# Patient Record
Sex: Male | Born: 1970 | Race: White | Hispanic: No | Marital: Married | Smoking: Never smoker
Health system: Southern US, Community
[De-identification: ages and names within clinical notes are randomized; demographics above are authoritative.]

## PROBLEM LIST (undated history)

## (undated) DIAGNOSIS — S43014A Anterior dislocation of right humerus, initial encounter: Secondary | ICD-10-CM

## (undated) DIAGNOSIS — S43006A Unspecified dislocation of unspecified shoulder joint, initial encounter: Secondary | ICD-10-CM

## (undated) DIAGNOSIS — M75101 Unspecified rotator cuff tear or rupture of right shoulder, not specified as traumatic: Secondary | ICD-10-CM

## (undated) HISTORY — PX: SHOULDER SURGERY: SHX246

---

## 2006-02-17 HISTORY — PX: LACERATION REPAIR: SHX5168

## 2006-06-22 ENCOUNTER — Emergency Department (HOSPITAL_COMMUNITY): Admission: EM | Admit: 2006-06-22 | Discharge: 2006-06-22 | Payer: Self-pay | Admitting: Emergency Medicine

## 2007-02-18 HISTORY — PX: OTHER SURGICAL HISTORY: SHX169

## 2007-07-14 ENCOUNTER — Encounter: Admission: RE | Admit: 2007-07-14 | Discharge: 2007-07-14 | Payer: Self-pay | Admitting: Family Medicine

## 2008-01-08 ENCOUNTER — Emergency Department (HOSPITAL_COMMUNITY): Admission: EM | Admit: 2008-01-08 | Discharge: 2008-01-08 | Payer: Self-pay | Admitting: Emergency Medicine

## 2008-01-17 ENCOUNTER — Encounter: Admission: RE | Admit: 2008-01-17 | Discharge: 2008-01-17 | Payer: Self-pay | Admitting: Orthopedic Surgery

## 2008-01-20 ENCOUNTER — Ambulatory Visit (HOSPITAL_BASED_OUTPATIENT_CLINIC_OR_DEPARTMENT_OTHER): Admission: RE | Admit: 2008-01-20 | Discharge: 2008-01-20 | Payer: Self-pay | Admitting: Orthopedic Surgery

## 2009-01-25 ENCOUNTER — Encounter: Admission: RE | Admit: 2009-01-25 | Discharge: 2009-01-25 | Payer: Self-pay | Admitting: Family Medicine

## 2009-01-27 ENCOUNTER — Ambulatory Visit (HOSPITAL_BASED_OUTPATIENT_CLINIC_OR_DEPARTMENT_OTHER): Admission: RE | Admit: 2009-01-27 | Discharge: 2009-01-27 | Payer: Self-pay | Admitting: Otolaryngology

## 2009-02-03 ENCOUNTER — Ambulatory Visit: Payer: Self-pay | Admitting: Internal Medicine
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
## 2010-07-05 NOTE — Consult Note (Signed)
NAME:  Clinton Stevenson, DEMICCO NO.:  [id number]   MEDICAL RECORD NO.:  [id number]          PATIENT TYPE:  EMS   LOCATION:  MAJO                         FACILITY:  [facility]   PHYSICIAN:  Antony Contras, MD     DATE OF BIRTH:  27-Sep-1970   DATE OF CONSULTATION:  06/22/2006  DATE OF DISCHARGE:                                 CONSULTATION   REQUESTING SERVICE:  Emergency department.   CHIEF COMPLAINT:  Facial laceration.   HISTORY OF PRESENT ILLNESS:  The patient is a 40 year old white male who  was walking his dog about 5:45 this evening when a neighbor's dog got  loose and attacked both him and his dog.  He sustained a laceration to  the lower lip extending into the cheek.  He had a significant amount of  bleeding, as well as pain, and came directly to the emergency  department.  His tetanus is up-to-date and the patient's rabies shots  are up to date.  He has no other complaints.   PAST MEDICAL HISTORY:  Depression.   PAST SURGICAL HISTORY:  Knee surgery.   MEDICATIONS:  Zoloft.   ALLERGIES:  NO KNOWN DRUG ALLERGIES.   FAMILY HISTORY:  Noncontributory.   SOCIAL HISTORY:  The patient lives locally.  He works as a Magazine features editor.  He denies smoking and drinks a couple of bottles of wine per week.   REVIEW OF SYSTEMS:  Negative except as listed above.   PHYSICAL EXAMINATION:  VITAL SIGNS:  Temperature 98.4, blood pressure  131/78, pulse 62, respirations 20.  GENERAL:  The patient is in no acute distress and is pleasant and  cooperative and accompanied by his wife.  VOICE:  Voice is normal.  EARS:  External ears are normal.  External canals are patent without  significant cerumen.  Tympanic membranes are intact and middle ear  spaces are aerated.  NOSE:  External nose is normal with nontender dorsum that is deviated  slightly to the left side.  He has very, very light abrasions to the  dorsum.  Nasal passages are patent bilaterally with a relatively midline  septum  and mild turbinate hypertrophy.  EYES:  Extraocular movements intact.  Pupils are equal, round and  reactive to light.  There is no orbital step-off.  FACE:  Facial bony structures are normal with normal occlusion and  normal facial bony contour.  There is a 5-cm laceration extending  through the left lower lip and extending toward the cheek laterally.  The laceration is not through-and-through.  There is no significant  bleeding presently.  There are no facial injuries.  MOUTH:  Oral cavity is normal with normal dentition and normal tongue.  The floor of mouth is normal.  Buccal mucosa is intact.  Oropharynx is  normal.  NECK:  There is no mass or adenopathy.  The neck is nontender.  THYROID GLAND:  There is no enlargement or mass.  CRANIAL NERVES:  II-XII are intact including normal lower lip movement  on both sides.   ASSESSMENT:  The patient is a 40 year old white male with a 5-cm left  lower lip laceration.   PLAN:  The laceration will be closed primarily in the emergency  department.  Dog bites carry a low risk of infection and so closure is  possible.  After closure, he will be stable for discharge from the  emergency department.  Wound care will be discussed, including cleaning  incision twice daily with half-strength peroxide and applying bacitracin  ointment.  He should keep the laceration dry for 2 days before shower.  Followup will be arranged in 1 week for suture removal.      Antony Contras, MD  Electronically Signed     DDB/MEDQ  D:  06/22/2006  T:  06/23/2006  Job:  [id number]   cc:   Antony Contras, MD

## 2010-07-05 NOTE — Op Note (Signed)
NAMEAYMAN, BRULL NO.:  [id number]   MEDICAL RECORD NO.:  [id number]          PATIENT TYPE:  EMS   LOCATION:  MAJO                         FACILITY:  [facility]   PHYSICIAN:  Antony Contras, MD     DATE OF BIRTH:  22-Oct-1970   DATE OF PROCEDURE:  06/22/2006  DATE OF DISCHARGE:                               OPERATIVE REPORT   PREOPERATIVE DIAGNOSIS:  Left lower lip laceration measuring 5 cm.   POSTOP DIAGNOSIS:   PROCEDURE:  Intermediate complexity closure of left lower lip  laceration.   SURGEON:  Dr. Christia Reading.   ANESTHESIA:  Local.   COMPLICATIONS:  None.   INDICATIONS:  The patient is a 40 year old white male who was attacked  by a dog earlier this evening sustaining the laceration described above.   FINDINGS:  There is a 5 cm laceration extending from the inner surface  of the lower lip on the left side and crossing the lower lip and  extending through this down the skin of the lower lip toward the left  cheek.  The laceration is not through the buccal mucosa otherwise.  There is no significant bleeding.   DESCRIPTION OF PROCEDURE:  The patient was identified in the emergency  department and informed consent was obtained.  The left cheek and mouth  were prepped and draped in sterile fashion.  The laceration was injected  with 2% lidocaine with 1:200,000 epinephrine.  The laceration was then  copiously irrigated with saline.  The deep layer was closed with 4-0  Vicryl suture in a simple interrupted fashion.  Skin layer was then  closed with 5-0 Prolene in a simple interrupted fashion through the skin  and 5-0 chromic in a simple interrupted fashion on the mucosa of the  lip.  After this, bacitracin ointment was applied.  The patient was then  returned to emergency room care room in stable condition without  complication.      Antony Contras, MD  Electronically Signed     DDB/MEDQ  D:  06/22/2006  T:  06/23/2006  Job:  [id number]   cc:    Antony Contras, MD

## 2013-05-09 ENCOUNTER — Encounter (HOSPITAL_COMMUNITY): Payer: Self-pay | Admitting: Emergency Medicine

## 2013-05-09 ENCOUNTER — Emergency Department (HOSPITAL_COMMUNITY): Payer: BC Managed Care – PPO

## 2013-05-09 ENCOUNTER — Emergency Department (HOSPITAL_COMMUNITY)
Admission: EM | Admit: 2013-05-09 | Discharge: 2013-05-09 | Disposition: A | Discharge status: Home / Self Care | Payer: BC Managed Care – PPO | Attending: Emergency Medicine | Admitting: Emergency Medicine

## 2013-05-09 DIAGNOSIS — Z9889 Other specified postprocedural states: Secondary | ICD-10-CM | POA: Insufficient documentation

## 2013-05-09 DIAGNOSIS — Y9289 Other specified places as the place of occurrence of the external cause: Secondary | ICD-10-CM | POA: Insufficient documentation

## 2013-05-09 DIAGNOSIS — S43016A Anterior dislocation of unspecified humerus, initial encounter: Secondary | ICD-10-CM | POA: Insufficient documentation

## 2013-05-09 DIAGNOSIS — W108XXA Fall (on) (from) other stairs and steps, initial encounter: Secondary | ICD-10-CM | POA: Insufficient documentation

## 2013-05-09 DIAGNOSIS — Y9389 Activity, other specified: Secondary | ICD-10-CM | POA: Insufficient documentation

## 2013-05-09 DIAGNOSIS — S42309A Unspecified fracture of shaft of humerus, unspecified arm, initial encounter for closed fracture: Secondary | ICD-10-CM | POA: Insufficient documentation

## 2013-05-09 DIAGNOSIS — F411 Generalized anxiety disorder: Secondary | ICD-10-CM | POA: Insufficient documentation

## 2013-05-09 MED ORDER — KETOROLAC TROMETHAMINE 30 MG/ML IJ SOLN
30.0000 mg | Freq: Once | INTRAMUSCULAR | Status: AC
  Administered 2013-05-09: 30 mg via INTRAVENOUS
  Filled 2013-05-09: qty 1

## 2013-05-09 MED ORDER — OXYCODONE-ACETAMINOPHEN 5-325 MG PO TABS: 1.0000 | ORAL_TABLET | Freq: Four times a day (QID) | ORAL | Status: DC | PRN

## 2013-05-09 MED ORDER — OXYCODONE-ACETAMINOPHEN 5-325 MG PO TABS
2.0000 | ORAL_TABLET | Freq: Once | ORAL | Status: AC
  Administered 2013-05-09: 2 via ORAL
  Filled 2013-05-09: qty 2

## 2013-05-09 MED ORDER — PROPOFOL 10 MG/ML IV BOLUS
INTRAVENOUS | Status: AC | PRN
  Administered 2013-05-09: 75 mg via INTRAVENOUS
  Administered 2013-05-09: 30 mg via INTRAVENOUS

## 2013-05-09 MED ORDER — PROPOFOL 10 MG/ML IV BOLUS
1.0000 mg/kg | Freq: Once | INTRAVENOUS | Status: AC
Start: 2013-05-09 — End: 2013-05-09

## 2013-05-09 MED ORDER — PROPOFOL 10 MG/ML IV BOLUS
0.5000 mg/kg | Freq: Once | INTRAVENOUS | Status: DC
  Filled 2013-05-09: qty 20

## 2013-05-09 NOTE — Discharge Instructions (Signed)

## 2013-05-09 NOTE — ED Notes (Signed)
Pt ambulatory and VS stable. No pain. Spouse will drive him home.

## 2013-05-09 NOTE — ED Provider Notes (Signed)
CSN: [id number]     Arrival date & time 05/09/13  0343 History   First MD Initiated Contact with Patient 05/09/13 0406     Chief Complaint  Patient presents with  . Fall  . Shoulder Pain     (Consider location/radiation/quality/duration/timing/severity/associated sxs/prior Treatment) Patient is a 43 y.o. male presenting with fall and shoulder pain. The history is provided by the patient.  Fall This is a new problem. The current episode started less than 1 hour ago. The problem occurs constantly. The problem has not changed since onset.Pertinent negatives include no chest pain, no abdominal pain, no headaches and no shortness of breath. Nothing aggravates the symptoms. Nothing relieves the symptoms. The treatment provided no relief.  Shoulder Pain This is a recurrent problem. The current episode started less than 1 hour ago. The problem occurs constantly. The problem has not changed since onset.Pertinent negatives include no chest pain, no abdominal pain, no headaches and no shortness of breath. Nothing aggravates the symptoms. Nothing relieves the symptoms. The treatment provided no relief.    History reviewed. No pertinent past medical history. Past Surgical History  Procedure Laterality Date  . Rotator cuff surgery    . Shoulder surgery Right     Rotator Cuff   No family history on file. History  Substance Use Topics  . Smoking status: Never Smoker   . Smokeless tobacco: Never Used  . Alcohol Use: Yes     Comment: occasionally    Review of Systems  Respiratory: Negative for shortness of breath.   Cardiovascular: Negative for chest pain.  Gastrointestinal: Negative for abdominal pain.  Neurological: Negative for headaches.  All other systems reviewed and are negative.      Allergies  Review of patient's allergies indicates not on file.  Home Medications  No current outpatient prescriptions on file. BP 148/113  Pulse 70  Temp(Src) 97.9 F (36.6 C) (Oral)  Resp 20   SpO2 100% Physical Exam  Constitutional: He is oriented to person, place, and time. He appears well-developed and well-nourished. No distress.  HENT:  Head: Normocephalic and atraumatic. Head is without raccoon's eyes and without Battle's sign.  Right Ear: No mastoid tenderness. No hemotympanum.  Left Ear: No mastoid tenderness. No hemotympanum.  Mouth/Throat: Oropharynx is clear and moist.  Eyes: Conjunctivae are normal. Pupils are equal, round, and reactive to light.  Neck: Normal range of motion. Neck supple.  No midline c t or l spine tenderness  Cardiovascular: Normal rate, regular rhythm and intact distal pulses.   Pulmonary/Chest: Effort normal and breath sounds normal. He has no wheezes. He has no rales.  Abdominal: Soft. Bowel sounds are normal. There is no tenderness. There is no rebound and no guarding.  Musculoskeletal: Normal range of motion.  RUE neurovascularly intact intact biceps and triceps tendons, intact flexion and extension pronation and supination.  Cap refill < 2 sec to all digits.  No winging of the scapula  Neurological: He is alert and oriented to person, place, and time. He has normal reflexes.  Skin: Skin is warm and dry.  Psychiatric: His mood appears anxious.    ED Course  ORTHOPEDIC INJURY TREATMENT Date/Time: 05/09/2013 5:00 AM Performed by: Jasmine Awe Authorized by: Jasmine Awe Consent: Verbal consent obtained. written consent obtained. Risks and benefits: risks, benefits and alternatives were discussed Consent given by: patient Patient understanding: patient states understanding of the procedure being performed Patient consent: the patient's understanding of the procedure matches consent given Procedure consent: procedure consent  matches procedure scheduled Site marked: the operative site was marked Imaging studies: imaging studies available Patient identity confirmed: arm band Injury location: shoulder Location details:  right shoulder Injury type: fracture-dislocation Dislocation type: inferior Pre-procedure neurovascular assessment: neurovascularly intact Pre-procedure distal perfusion: normal Pre-procedure neurological function: normal Pre-procedure range of motion: reduced Local anesthesia used: no Patient sedated: yes Sedation type: moderate (conscious) sedation Sedatives: propofol Vitals: Vital signs were monitored during sedation. Manipulation performed: yes Skeletal traction used: yes Reduction successful: yes X-ray confirmed reduction: yes Immobilization: sling Post-procedure neurovascular assessment: post-procedure neurovascularly intact Post-procedure distal perfusion: normal Post-procedure range of motion: normal Patient tolerance: Patient tolerated the procedure well with no immediate complications.   (including critical care time) Labs Review Labs Reviewed - No data to display Imaging Review No results found.   EKG Interpretation None      MDM   Final diagnoses:  None    440 am case d/w Dr. Dion SaucierLandau via phone who reviewed filsm.  Achieve good sedation with propofol and attempt gentle traction to reduce.  Then immobilize and obtain CT.  Refer to office as outpatient  Patient and wife informed of need for close follow up with Dr. Dion SaucierLandau given patient's history.  Both verbalize understanding and agree to follow up  Corneisha Alvi K Gibril Mastro-Rasch, MD 05/09/13 [phone]

## 2013-05-09 NOTE — Sedation Documentation (Signed)
Patient back at baseline

## 2013-05-09 NOTE — ED Notes (Signed)
Denies LOC. Denies head trauma. Patient states that now he is having some numbness in his right upper extremity. Pulses palpable.

## 2013-05-09 NOTE — ED Notes (Signed)
Patient was taking dog out for a walk, dog pulled patient and patient fell down 4 steps and landed on his right shoulder on the concrete. Patient is guarding extremity, CNS intact.

## 2013-05-12 ENCOUNTER — Emergency Department (HOSPITAL_COMMUNITY): Payer: BC Managed Care – PPO

## 2013-05-12 ENCOUNTER — Emergency Department (HOSPITAL_COMMUNITY)
Admission: EM | Admit: 2013-05-12 | Discharge: 2013-05-12 | Disposition: A | Discharge status: Home / Self Care | Payer: BC Managed Care – PPO | Attending: Emergency Medicine | Admitting: Emergency Medicine

## 2013-05-12 ENCOUNTER — Encounter (HOSPITAL_COMMUNITY): Payer: Self-pay | Admitting: Emergency Medicine

## 2013-05-12 DIAGNOSIS — Y939 Activity, unspecified: Secondary | ICD-10-CM | POA: Insufficient documentation

## 2013-05-12 DIAGNOSIS — Y929 Unspecified place or not applicable: Secondary | ICD-10-CM | POA: Insufficient documentation

## 2013-05-12 DIAGNOSIS — Z79899 Other long term (current) drug therapy: Secondary | ICD-10-CM | POA: Insufficient documentation

## 2013-05-12 DIAGNOSIS — S43014A Anterior dislocation of right humerus, initial encounter: Secondary | ICD-10-CM

## 2013-05-12 DIAGNOSIS — S43016A Anterior dislocation of unspecified humerus, initial encounter: Secondary | ICD-10-CM | POA: Insufficient documentation

## 2013-05-12 DIAGNOSIS — X58XXXA Exposure to other specified factors, initial encounter: Secondary | ICD-10-CM | POA: Insufficient documentation

## 2013-05-12 HISTORY — DX: Unspecified dislocation of unspecified shoulder joint, initial encounter: S43.006A

## 2013-05-12 MED ORDER — HYDROMORPHONE HCL 2 MG PO TABS: 2.0000 mg | ORAL_TABLET | ORAL | Status: DC | PRN

## 2013-05-12 MED ORDER — PROPOFOL 10 MG/ML IV BOLUS
INTRAVENOUS | Status: AC | PRN
  Administered 2013-05-12 (×2): 40 mg via INTRAVENOUS

## 2013-05-12 MED ORDER — KETOROLAC TROMETHAMINE 30 MG/ML IJ SOLN
30.0000 mg | Freq: Once | INTRAMUSCULAR | Status: AC
  Administered 2013-05-12: 30 mg via INTRAVENOUS
  Filled 2013-05-12: qty 1

## 2013-05-12 MED ORDER — HYDROMORPHONE HCL PF 1 MG/ML IJ SOLN
1.0000 mg | Freq: Once | INTRAMUSCULAR | Status: AC
  Administered 2013-05-12: 1 mg via INTRAVENOUS
  Filled 2013-05-12: qty 1

## 2013-05-12 MED ORDER — PROPOFOL 10 MG/ML IV BOLUS
200.0000 mg | Freq: Once | INTRAVENOUS | Status: AC
  Administered 2013-05-12: 200 mg via INTRAVENOUS
  Filled 2013-05-12: qty 20

## 2013-05-12 MED ORDER — ONDANSETRON HCL 4 MG/2ML IJ SOLN
4.0000 mg | Freq: Once | INTRAMUSCULAR | Status: AC
  Administered 2013-05-12: 4 mg via INTRAVENOUS
  Filled 2013-05-12: qty 2

## 2013-05-12 NOTE — ED Notes (Signed)
Pt had R shoulder reduced on Monday.  R shoulder dislocated at 0230 this am.  Pt was sent here by Dr Madelon Lipsaffrey b/c anesthesiologist would not allow reduction d/t pt eating 2 fig newtons at 0800.

## 2013-05-12 NOTE — Progress Notes (Signed)
Orthopedic Tech Progress Note Patient Details:  Clinton Stevenson 1970-06-07 [id number]  Ortho Devices Type of Ortho Device: Shoulder immobilizer Ortho Device/Splint Interventions: Application   Cammer, Mickie BailJennifer Carol 05/12/2013, 12:13 PM

## 2013-05-12 NOTE — ED Provider Notes (Signed)
I saw and evaluated the patient, reviewed the resident's note and I agree with the findings and plan.   EKG Interpretation None      Pt is a 43 y.o. M with R shoulder dislocation.  [facility]  Sent to ED for reduction. Patient was seen 3/23 for another anterior right shoulder dislocation. Patient given propofol and shoulder was successfully reduced. Postreduction x-ray is normal. Patient is neurovascular intact. We'll discharge home.  Procedural sedation Performed by: Raelyn NumberWARD, Xabi Wittler N  and resident Windy Cannyouglas Bratlik Consent: Verbal consent obtained. Risks and benefits: risks, benefits and alternatives were discussed Required items: required blood products, implants, devices, and special equipment available Patient identity confirmed: arm band and provided demographic data Time out: Immediately prior to procedure a "time out" was called to verify the correct patient, procedure, equipment, support staff and site/side marked as required.  Sedation type: moderate (conscious) sedation NPO time confirmed and considedered  Sedatives: PROPOFOL  Physician Time at Bedside: 11:48 AM  Vitals: Vital signs were monitored during sedation. Cardiac Monitor, pulse oximeter Patient tolerance: Patient tolerated the procedure well with no immediate complications. Comments: Pt with uneventful recovered. Returned to pre-procedural sedation baseline     Reduction of dislocation Date/Time: 12:03 PM Performed by: Raelyn NumberWARD, Kamarion Zagami N and resident Windy Cannyouglas Bratlik Authorized by: Raelyn NumberWARD, Rhyan Radler N Consent: Verbal consent obtained. Risks and benefits: risks, benefits and alternatives were discussed Consent given by: patient Required items: required blood products, implants, devices, and special equipment available Time out: Immediately prior to procedure a "time out" was called to verify the correct patient, procedure, equipment, support staff and site/side marked as  required.  Patient sedated: with propofol  Vitals: Vital signs were monitored during sedation. Patient tolerance: Patient tolerated the procedure well with no immediate complications. Joint: R shoulder Reduction technique: traction, external rotation, adduction    Layla MawKristen N Virjean Boman, DO 05/12/13 1545

## 2013-05-12 NOTE — ED Notes (Signed)
Family at bedside. 

## 2013-05-12 NOTE — Discharge Instructions (Signed)

## 2013-05-12 NOTE — ED Provider Notes (Signed)
CSN: [id number]     Arrival date & time 05/12/13  1016 History   First MD Initiated Contact with Patient 05/12/13 1019     Chief Complaint  Patient presents with  . shoulder dislocation      (Consider location/radiation/quality/duration/timing/severity/associated sxs/prior Treatment) Patient is a 43 y.o. male presenting with shoulder injury. The history is provided by the patient.  Shoulder Injury This is a recurrent (pt shoulder dislocated 3 days ago, seen in Ed, reduced, had CT with ? fx. Saw Ortho yesterday, and arranged for MRI, and then again today after dislocated, but sent here once reviewed XR) problem. The current episode started today. The problem occurs constantly. The problem has been unchanged. Pertinent negatives include no abdominal pain, chest pain, chills, congestion, coughing, fever, nausea, numbness, rash, vomiting or weakness. Nothing aggravates the symptoms. He has tried nothing for the symptoms. The treatment provided no relief.    Past Medical History  Diagnosis Date  . Shoulder dislocation     r   Past Surgical History  Procedure Laterality Date  . Rotator cuff surgery    . Shoulder surgery Right     Rotator Cuff   No family history on file. History  Substance Use Topics  . Smoking status: Never Smoker   . Smokeless tobacco: Never Used  . Alcohol Use: Yes     Comment: occasionally    Review of Systems  Constitutional: Negative for fever, chills, activity change and appetite change.  HENT: Negative for congestion and rhinorrhea.   Eyes: Negative for discharge and itching.  Respiratory: Negative for cough, shortness of breath and wheezing.   Cardiovascular: Negative for chest pain.  Gastrointestinal: Negative for nausea, vomiting, abdominal pain, diarrhea and constipation.  Genitourinary: Negative for hematuria, decreased urine volume and difficulty urinating.  Skin: Negative for rash and wound.  Neurological: Negative for syncope, weakness and  numbness.  All other systems reviewed and are negative.      Allergies  Review of patient's allergies indicates no known allergies.  Home Medications   Current Outpatient Rx  Name  Route  Sig  Dispense  Refill  . ALPRAZolam (XANAX) 0.25 MG tablet   Oral   Take 0.25 mg by mouth 2 (two) times daily as needed for anxiety.          . Ibuprofen-Diphenhydramine Cit (IBUPROFEN PM PO)   Oral   Take 1-2 tablets by mouth daily as needed (for pain).         Marland Kitchen oxyCODONE-acetaminophen (PERCOCET/ROXICET) 5-325 MG per tablet   Oral   Take 1 tablet by mouth every 6 (six) hours as needed for moderate pain or severe pain.         Marland Kitchen sertraline (ZOLOFT) 50 MG tablet   Oral   Take 50 mg by mouth daily.          Marland Kitchen HYDROmorphone (DILAUDID) 2 MG tablet   Oral   Take 1 tablet (2 mg total) by mouth every 4 (four) hours as needed for severe pain.   30 tablet   0    BP 138/71  Pulse 70  Temp(Src) 98.3 F (36.8 C) (Oral)  Resp 12  Ht 5\' 6"  (1.676 m)  Wt 170 lb (77.111 kg)  BMI 27.45 kg/m2  SpO2 97% Physical Exam  Vitals reviewed. Constitutional: He is oriented to person, place, and time. He appears well-developed and well-nourished. No distress.  In obvious pain  HENT:  Head: Normocephalic and atraumatic.  Mouth/Throat: Oropharynx is clear and moist.  No oropharyngeal exudate.  Eyes: Conjunctivae and EOM are normal. Pupils are equal, round, and reactive to light. Right eye exhibits no discharge. Left eye exhibits no discharge. No scleral icterus.  Neck: Normal range of motion. Neck supple.  Cardiovascular: Normal rate, regular rhythm, normal heart sounds and intact distal pulses.  Exam reveals no gallop and no friction rub.   No murmur heard. Pulmonary/Chest: Effort normal and breath sounds normal. No respiratory distress. He has no wheezes. He has no rales.  Abdominal: Soft. He exhibits no distension and no mass. There is no tenderness.  Musculoskeletal: Normal range of motion.   TTP diffuse over R shoulder. Cannot move shoulder without extreme pain. Sensation intact over deltoid. Appears to be dislocated  Neurological: He is alert and oriented to person, place, and time. No cranial nerve deficit. He exhibits normal muscle tone. Coordination normal.  Skin: Skin is warm. No rash noted. He is not diaphoretic.    ED Course  ORTHOPEDIC INJURY TREATMENT Date/Time: 05/12/2013 12:54 PM Performed by: Pilar JarvisBRTALIK, Cheryel Kyte Authorized by: Raelyn NumberWARD, KRISTEN N Consent: Verbal consent obtained. Risks and benefits: risks, benefits and alternatives were discussed Consent given by: patient Patient understanding: patient states understanding of the procedure being performed Patient consent: the patient's understanding of the procedure matches consent given Procedure consent: procedure consent matches procedure scheduled Relevant documents: relevant documents present and verified Test results: test results available and properly labeled Site marked: the operative site was marked Imaging studies: imaging studies available (XR sent on CD from clinic, reviewed by myself and attending showing R anterior dislocation) Required items: required blood products, implants, devices, and special equipment available Patient identity confirmed: verbally with patient, arm band and hospital-assigned identification number Time out: Immediately prior to procedure a "time out" was called to verify the correct patient, procedure, equipment, support staff and site/side marked as required. Injury location: shoulder Location details: right shoulder Injury type: dislocation Dislocation type: anterior Hill-Sachs deformity: no Chronicity: recurrent Pre-procedure neurovascular assessment: neurovascularly intact Pre-procedure distal perfusion: normal Pre-procedure neurological function: normal Pre-procedure range of motion: reduced Local anesthesia used: no Patient sedated: yes Sedation type: moderate (conscious)  sedation Sedatives: propofol Analgesia: hydromorphone Vitals: Vital signs were monitored during sedation. Manipulation performed: yes Reduction method: traction and counter traction and external rotation Reduction successful: yes X-ray confirmed reduction: yes Immobilization: sling Post-procedure neurovascular assessment: post-procedure neurovascularly intact Post-procedure distal perfusion: normal Post-procedure neurological function: normal Post-procedure range of motion: normal Patient tolerance: Patient tolerated the procedure well with no immediate complications.   (including critical care time) Labs Review Labs Reviewed - No data to display Imaging Review Dg Shoulder Right  05/12/2013   CLINICAL DATA:  Status post reduction  EXAM: RIGHT SHOULDER - 2+ VIEW  COMPARISON:  05/09/2013  FINDINGS: The humeral head remains well located in the glenoid labrum. No acute fracture is seen. The small bony fragment seen on recent CT is not well appreciated on this exam.  IMPRESSION: The humeral head remains well located.   Electronically Signed   By: Alcide CleverMark  Lukens M.D.   On: 05/12/2013 12:33     EKG Interpretation None      MDM   MDM: 43 y.o. WM w/ cc: of R shoulder dislcoation. Seen 3 days ago in ED after dislocation and reduced. Referred to Ortho and saw yesterday, who recommend MRI. Re-dislocated today, same shoulder. Went to ortho clinic who did XR and sent here. Large amount of pain. NVI. AFVSS, well appearing except in pain and reduced movement in all planes  of R shoulder d/t pain. Likely out. CD from ortho clinic reviewed shows anterior dislocation. Propofol sedation performed and shoulder reduced. Post reduction shows good relocation. Pt pain greatly improved. Placed in shoulder immobilizer. Recommend call Ortho today or early tomorrow for follow up. Given rx for Dilaudid. Discharged. Care of case d/w my attending.  Final diagnoses:  Anterior dislocation of right shoulder     Discharged   Pilar Jarvis, MD 05/12/13 1355

## 2013-05-12 NOTE — ED Notes (Signed)
Patient is alert and orientedx4.  Patient was explained discharge instructions and they understood them with no questions.  The patient's wife, Clinton Stevenson is taking the patient home.

## 2013-05-23 ENCOUNTER — Encounter (HOSPITAL_BASED_OUTPATIENT_CLINIC_OR_DEPARTMENT_OTHER): Payer: Self-pay | Admitting: *Deleted

## 2013-05-23 NOTE — Progress Notes (Signed)
No labs needed-pt was here for shoulder rcr 2009

## 2013-05-26 ENCOUNTER — Other Ambulatory Visit: Payer: Self-pay | Admitting: Orthopedic Surgery

## 2013-05-27 ENCOUNTER — Encounter (HOSPITAL_BASED_OUTPATIENT_CLINIC_OR_DEPARTMENT_OTHER): Payer: BC Managed Care – PPO | Admitting: Anesthesiology

## 2013-05-27 ENCOUNTER — Ambulatory Visit (HOSPITAL_BASED_OUTPATIENT_CLINIC_OR_DEPARTMENT_OTHER): Payer: BC Managed Care – PPO | Admitting: Anesthesiology

## 2013-05-27 ENCOUNTER — Encounter (HOSPITAL_BASED_OUTPATIENT_CLINIC_OR_DEPARTMENT_OTHER): Payer: Self-pay | Admitting: *Deleted

## 2013-05-27 ENCOUNTER — Ambulatory Visit (HOSPITAL_BASED_OUTPATIENT_CLINIC_OR_DEPARTMENT_OTHER)
Admission: RE | Admit: 2013-05-27 | Discharge: 2013-05-27 | Disposition: A | Discharge status: Home / Self Care | Payer: BC Managed Care – PPO | Source: Ambulatory Visit | Attending: Orthopedic Surgery | Admitting: Orthopedic Surgery

## 2013-05-27 ENCOUNTER — Encounter (HOSPITAL_BASED_OUTPATIENT_CLINIC_OR_DEPARTMENT_OTHER)
Admission: RE | Disposition: A | Discharge status: Home / Self Care | Payer: Self-pay | Source: Ambulatory Visit | Attending: Orthopedic Surgery

## 2013-05-27 DIAGNOSIS — M25819 Other specified joint disorders, unspecified shoulder: Secondary | ICD-10-CM | POA: Insufficient documentation

## 2013-05-27 DIAGNOSIS — M719 Bursopathy, unspecified: Secondary | ICD-10-CM | POA: Insufficient documentation

## 2013-05-27 DIAGNOSIS — S43014A Anterior dislocation of right humerus, initial encounter: Secondary | ICD-10-CM

## 2013-05-27 DIAGNOSIS — M67919 Unspecified disorder of synovium and tendon, unspecified shoulder: Secondary | ICD-10-CM | POA: Insufficient documentation

## 2013-05-27 DIAGNOSIS — M75101 Unspecified rotator cuff tear or rupture of right shoulder, not specified as traumatic: Secondary | ICD-10-CM

## 2013-05-27 DIAGNOSIS — M24419 Recurrent dislocation, unspecified shoulder: Secondary | ICD-10-CM | POA: Insufficient documentation

## 2013-05-27 DIAGNOSIS — M7580 Other shoulder lesions, unspecified shoulder: Secondary | ICD-10-CM

## 2013-05-27 HISTORY — DX: Anterior dislocation of right humerus, initial encounter: S43.014A

## 2013-05-27 HISTORY — DX: Unspecified rotator cuff tear or rupture of right shoulder, not specified as traumatic: M75.101

## 2013-05-27 LAB — POCT HEMOGLOBIN-HEMACUE: HEMOGLOBIN: 14 g/dL (ref 13.0–17.0)

## 2013-05-27 SURGERY — SHOULDER ATHROSCOPY WITH CAPSULORRHAPHY
Anesthesia: General | Site: Shoulder | Laterality: Right

## 2013-05-27 MED ORDER — FENTANYL CITRATE 0.05 MG/ML IJ SOLN
50.0000 ug | INTRAMUSCULAR | Status: DC | PRN
  Administered 2013-05-27: 100 ug via INTRAVENOUS

## 2013-05-27 MED ORDER — LACTATED RINGERS IV SOLN
INTRAVENOUS | Status: DC
  Administered 2013-05-27 (×3): via INTRAVENOUS

## 2013-05-27 MED ORDER — PROPOFOL 10 MG/ML IV BOLUS
INTRAVENOUS | Status: DC | PRN
  Administered 2013-05-27: 200 mg via INTRAVENOUS

## 2013-05-27 MED ORDER — CEFAZOLIN SODIUM-DEXTROSE 2-3 GM-% IV SOLR
INTRAVENOUS | Status: AC
  Filled 2013-05-27: qty 50

## 2013-05-27 MED ORDER — SENNA-DOCUSATE SODIUM 8.6-50 MG PO TABS: 2.0000 | ORAL_TABLET | Freq: Every day | ORAL | Status: AC

## 2013-05-27 MED ORDER — MIDAZOLAM HCL 2 MG/2ML IJ SOLN
INTRAMUSCULAR | Status: AC
  Filled 2013-05-27: qty 2

## 2013-05-27 MED ORDER — CEFAZOLIN SODIUM-DEXTROSE 2-3 GM-% IV SOLR
2.0000 g | INTRAVENOUS | Status: AC
  Administered 2013-05-27: 2 g via INTRAVENOUS

## 2013-05-27 MED ORDER — ONDANSETRON HCL 4 MG/2ML IJ SOLN
INTRAMUSCULAR | Status: DC | PRN
  Administered 2013-05-27: 4 mg via INTRAVENOUS

## 2013-05-27 MED ORDER — DEXAMETHASONE SODIUM PHOSPHATE 4 MG/ML IJ SOLN
INTRAMUSCULAR | Status: DC | PRN
  Administered 2013-05-27: 10 mg via INTRAVENOUS

## 2013-05-27 MED ORDER — HYDROMORPHONE HCL PF 1 MG/ML IJ SOLN: 0.2500 mg | INTRAMUSCULAR | Status: DC | PRN

## 2013-05-27 MED ORDER — FENTANYL CITRATE 0.05 MG/ML IJ SOLN
INTRAMUSCULAR | Status: DC | PRN
  Administered 2013-05-27: 50 ug via INTRAVENOUS

## 2013-05-27 MED ORDER — LIDOCAINE HCL (CARDIAC) 20 MG/ML IV SOLN
INTRAVENOUS | Status: DC | PRN
  Administered 2013-05-27: 50 mg via INTRAVENOUS

## 2013-05-27 MED ORDER — PROPOFOL 10 MG/ML IV EMUL
INTRAVENOUS | Status: AC
  Filled 2013-05-27: qty 50

## 2013-05-27 MED ORDER — OXYCODONE HCL 5 MG PO TABS
5.0000 mg | ORAL_TABLET | Freq: Once | ORAL | Status: DC | PRN
Start: 2013-05-27 — End: 2013-05-27

## 2013-05-27 MED ORDER — MIDAZOLAM HCL 2 MG/2ML IJ SOLN
1.0000 mg | INTRAMUSCULAR | Status: DC | PRN
  Administered 2013-05-27: 2 mg via INTRAVENOUS

## 2013-05-27 MED ORDER — OXYCODONE-ACETAMINOPHEN 10-325 MG PO TABS: 1.0000 | ORAL_TABLET | Freq: Four times a day (QID) | ORAL | Status: DC | PRN

## 2013-05-27 MED ORDER — SUCCINYLCHOLINE CHLORIDE 20 MG/ML IJ SOLN
INTRAMUSCULAR | Status: DC | PRN
  Administered 2013-05-27: 100 mg via INTRAVENOUS

## 2013-05-27 MED ORDER — FENTANYL CITRATE 0.05 MG/ML IJ SOLN
INTRAMUSCULAR | Status: AC
  Filled 2013-05-27: qty 2

## 2013-05-27 MED ORDER — SUCCINYLCHOLINE CHLORIDE 20 MG/ML IJ SOLN
INTRAMUSCULAR | Status: AC
  Filled 2013-05-27: qty 1

## 2013-05-27 MED ORDER — OXYCODONE HCL 5 MG/5ML PO SOLN: 5.0000 mg | Freq: Once | ORAL | Status: DC | PRN

## 2013-05-27 MED ORDER — PROMETHAZINE HCL 25 MG PO TABS: 25.0000 mg | ORAL_TABLET | Freq: Four times a day (QID) | ORAL | Status: AC | PRN

## 2013-05-27 MED ORDER — BUPIVACAINE-EPINEPHRINE PF 0.5-1:200000 % IJ SOLN
INTRAMUSCULAR | Status: DC | PRN
  Administered 2013-05-27: 25 mL via PERINEURAL

## 2013-05-27 MED ORDER — SODIUM CHLORIDE 0.9 % IR SOLN
Status: DC | PRN
  Administered 2013-05-27 (×4): 3000 mL
  Administered 2013-05-27: 12000 mL

## 2013-05-27 MED ORDER — FENTANYL CITRATE 0.05 MG/ML IJ SOLN
INTRAMUSCULAR | Status: AC
  Filled 2013-05-27: qty 6

## 2013-05-27 MED ORDER — ONDANSETRON HCL 4 MG/2ML IJ SOLN: 4.0000 mg | Freq: Once | INTRAMUSCULAR | Status: DC | PRN

## 2013-05-27 MED ORDER — METHOCARBAMOL 500 MG PO TABS: 500.0000 mg | ORAL_TABLET | Freq: Four times a day (QID) | ORAL | Status: DC

## 2013-05-27 SURGICAL SUPPLY — 80 items
ANCH SUT SHRT 12.5 CANN EYLT (Anchor) ×1 IMPLANT
ANCH SUT SWLK 19.1X4.75 (Anchor) ×1 IMPLANT
ANCH SUT SWLK 19.1X5.5 CLS EL (Anchor) IMPLANT
ANCH SUT SWLK 19.1X6.25 CLS (Anchor) ×1 IMPLANT
ANCHOR PEEK SWIVEL LOCK 5.5 (Anchor) IMPLANT
ANCHOR SUT BIO SW 4.75X19.1 (Anchor) ×2 IMPLANT
ANCHOR SUT BIOCOMP LK 2.9X12.5 (Anchor) ×2 IMPLANT
ANCHOR SUT SWIVELLOK BIO (Anchor) ×2 IMPLANT
APL SKNCLS STERI-STRIP NONHPOA (GAUZE/BANDAGES/DRESSINGS) ×1
BENZOIN TINCTURE PRP APPL 2/3 (GAUZE/BANDAGES/DRESSINGS) ×3 IMPLANT
BLADE CUTTER GATOR 3.5 (BLADE) ×3 IMPLANT
BLADE GREAT WHITE 4.2 (BLADE) IMPLANT
BLADE GREAT WHITE 4.2MM (BLADE)
BLADE SURG 15 STRL LF DISP TIS (BLADE) IMPLANT
BLADE SURG 15 STRL SS (BLADE)
BUR OVAL 4.0 (BURR) IMPLANT
BUR OVAL 6.0 (BURR) IMPLANT
CANNULA 5.75X71 LONG (CANNULA) ×3 IMPLANT
CANNULA TWIST IN 8.25X7CM (CANNULA) ×4 IMPLANT
CANNULA TWIST IN 8.25X9CM (CANNULA) IMPLANT
CLOSURE WOUND 1/2 X4 (GAUZE/BANDAGES/DRESSINGS) ×1
DECANTER SPIKE VIAL GLASS SM (MISCELLANEOUS) IMPLANT
DRAPE INCISE IOBAN 66X45 STRL (DRAPES) ×3 IMPLANT
DRAPE SHOULDER BEACH CHAIR (DRAPES) ×3 IMPLANT
DRAPE U 20/CS (DRAPES) ×3 IMPLANT
DRAPE U-SHAPE 47X51 STRL (DRAPES) ×3 IMPLANT
DRSG PAD ABDOMINAL 8X10 ST (GAUZE/BANDAGES/DRESSINGS) ×3 IMPLANT
DURAPREP 26ML APPLICATOR (WOUND CARE) ×3 IMPLANT
ELECT REM PT RETURN 9FT ADLT (ELECTROSURGICAL)
ELECTRODE REM PT RTRN 9FT ADLT (ELECTROSURGICAL) IMPLANT
FIBERSTICK 2 (SUTURE) IMPLANT
GLOVE BIO SURGEON STRL SZ8 (GLOVE) ×3 IMPLANT
GLOVE BIOGEL PI IND STRL 7.0 (GLOVE) IMPLANT
GLOVE BIOGEL PI IND STRL 8 (GLOVE) ×2 IMPLANT
GLOVE BIOGEL PI INDICATOR 7.0 (GLOVE) ×2
GLOVE BIOGEL PI INDICATOR 8 (GLOVE) ×4
GLOVE ECLIPSE 6.5 STRL STRAW (GLOVE) ×2 IMPLANT
GLOVE EXAM NITRILE LRG STRL (GLOVE) ×2 IMPLANT
GLOVE ORTHO TXT STRL SZ7.5 (GLOVE) ×3 IMPLANT
GOWN STRL REUS W/ TWL LRG LVL3 (GOWN DISPOSABLE) ×1 IMPLANT
GOWN STRL REUS W/ TWL XL LVL3 (GOWN DISPOSABLE) ×2 IMPLANT
GOWN STRL REUS W/TWL LRG LVL3 (GOWN DISPOSABLE) ×3
GOWN STRL REUS W/TWL XL LVL3 (GOWN DISPOSABLE) ×6
IMMOBILIZER SHOULDER FOAM XLGE (SOFTGOODS) IMPLANT
IV NS IRRIG 3000ML ARTHROMATIC (IV SOLUTION) ×19 IMPLANT
KIT DISPOSABLE PUSHLOCK 2.9MM (KITS) ×2 IMPLANT
KIT SHOULDER TRACTION (DRAPES) ×3 IMPLANT
LASSO 90 CVE QUICKPAS (DISPOSABLE) ×2 IMPLANT
MANIFOLD NEPTUNE II (INSTRUMENTS) ×3 IMPLANT
NDL SCORPION MULTI FIRE (NEEDLE) IMPLANT
NEEDLE SCORPION MULTI FIRE (NEEDLE) ×3 IMPLANT
PACK ARTHROSCOPY DSU (CUSTOM PROCEDURE TRAY) ×3 IMPLANT
PACK BASIN DAY SURGERY FS (CUSTOM PROCEDURE TRAY) ×3 IMPLANT
SET ARTHROSCOPY TUBING (MISCELLANEOUS) ×3
SET ARTHROSCOPY TUBING LN (MISCELLANEOUS) ×1 IMPLANT
SHEET MEDIUM DRAPE 40X70 STRL (DRAPES) ×3 IMPLANT
SLEEVE SCD COMPRESS KNEE MED (MISCELLANEOUS) ×3 IMPLANT
SLING ARM IMMOBILIZER LRG (SOFTGOODS) ×2 IMPLANT
SLING ARM IMMOBILIZER MED (SOFTGOODS) IMPLANT
SLING ARM LRG ADULT FOAM STRAP (SOFTGOODS) IMPLANT
SLING ARM MED ADULT FOAM STRAP (SOFTGOODS) IMPLANT
SLING ARM XL FOAM STRAP (SOFTGOODS) IMPLANT
SPONGE GAUZE 4X4 12PLY (GAUZE/BANDAGES/DRESSINGS) ×3 IMPLANT
STRIP CLOSURE SKIN 1/2X4 (GAUZE/BANDAGES/DRESSINGS) ×2 IMPLANT
SUT FIBERWIRE #2 38 T-5 BLUE (SUTURE) ×3
SUT MNCRL AB 4-0 PS2 18 (SUTURE) IMPLANT
SUT PDS AB 1 CT  36 (SUTURE)
SUT PDS AB 1 CT 36 (SUTURE) IMPLANT
SUT TIGER TAPE 7 IN WHITE (SUTURE) ×2 IMPLANT
SUT VIC AB 3-0 SH 27 (SUTURE)
SUT VIC AB 3-0 SH 27X BRD (SUTURE) IMPLANT
SUTURE FIBERWR #2 38 T-5 BLUE (SUTURE) IMPLANT
TAPE FIBER 2MM 7IN #2 BLUE (SUTURE) ×2 IMPLANT
TAPE SUT LABRALTAP WHT/BLK (SUTURE) ×2 IMPLANT
TOWEL OR 17X24 6PK STRL BLUE (TOWEL DISPOSABLE) ×5 IMPLANT
TOWEL OR NON WOVEN STRL DISP B (DISPOSABLE) ×4 IMPLANT
TUBE CONNECTING 20'X1/4 (TUBING)
TUBE CONNECTING 20X1/4 (TUBING) IMPLANT
WAND STAR VAC 90 (SURGICAL WAND) ×2 IMPLANT
WATER STERILE IRR 1000ML POUR (IV SOLUTION) ×3 IMPLANT

## 2013-05-27 NOTE — Discharge Instructions (Signed)
Diet: As you were doing prior to hospitalization  ° °Shower:  May shower but keep the wounds dry, use an occlusive plastic wrap, NO SOAKING IN TUB.  If the bandage gets wet, change with a clean dry gauze. ° °Dressing:  You may change your dressing 3-5 days after surgery.  Then change the dressing daily with sterile gauze dressing.   ° °There are sticky tapes (steri-strips) on your wounds and all the stitches are absorbable.  Leave the steri-strips in place when changing your dressings, they will peel off with time, usually 2-3 weeks. ° °Activity:  Increase activity slowly as tolerated, but follow the weight bearing instructions below.  No lifting or driving for 6 weeks. ° °Weight Bearing:   Sling at all times..   ° °To prevent constipation: you may use a stool softener such as - ° °Colace (over the counter) 100 mg by mouth twice a day  °Drink plenty of fluids (prune juice may be helpful) and high fiber foods °Miralax (over the counter) for constipation as needed.   ° °Itching:  If you experience itching with your medications, try taking only a single pain pill, or even half a pain pill at a time.  You may take up to 10 pain pills per day, and you can also use benadryl over the counter for itching or also to help with sleep.  ° °Precautions:  If you experience chest pain or shortness of breath - call 911 immediately for transfer to the hospital emergency department!! ° °If you develop a fever greater that 101 F, purulent drainage from wound, increased redness or drainage from wound, or calf pain -- Call the office at [phone]                                                °Follow- Up Appointment:  Please call for an appointment to be seen in 2 weeks [place] - [phone] ° ° °Post Anesthesia Home Care Instructions ° °Activity: °Get plenty of rest for the remainder of the day. A responsible adult should stay with you for 24 hours following the procedure.  °For the next 24 hours, DO NOT: °-Drive a  car °-Operate machinery °-Drink alcoholic beverages °-Take any medication unless instructed by your physician °-Make any legal decisions or sign important papers. ° °Meals: °Start with liquid foods such as gelatin or soup. Progress to regular foods as tolerated. Avoid greasy, spicy, heavy foods. If nausea and/or vomiting occur, drink only clear liquids until the nausea and/or vomiting subsides. Call your physician if vomiting continues. ° °Special Instructions/Symptoms: °Your throat may feel dry or sore from the anesthesia or the breathing tube placed in your throat during surgery. If this causes discomfort, gargle with warm salt water. The discomfort should disappear within 24 hours. ° ° °Regional Anesthesia Blocks ° °1. Numbness or the inability to move the "blocked" extremity may last from 3-48 hours after placement. The length of time depends on the medication injected and your individual response to the medication. If the numbness is not going away after 48 hours, call your surgeon. ° °2. The extremity that is blocked will need to be protected until the numbness is gone and the  Strength has returned. Because you cannot feel it, you will need to take extra care to avoid injury. Because it may be weak, you may have difficulty moving   it or using it. You may not know what position it is in without looking at it while the block is in effect. ° °3. For blocks in the legs and feet, returning to weight bearing and walking needs to be done carefully. You will need to wait until the numbness is entirely gone and the strength has returned. You should be able to move your leg and foot normally before you try and bear weight or walk. You will need someone to be with you when you first try to ensure you do not fall and possibly risk injury. ° °4. Bruising and tenderness at the needle site are common side effects and will resolve in a few days. ° °5. Persistent numbness or new problems with movement should be communicated to  the surgeon or the [facility] ([phone])/ [facility] ([phone]). ° ° ° °

## 2013-05-27 NOTE — Anesthesia Postprocedure Evaluation (Signed)
  Anesthesia Post-op Note  Patient: Clinton Stevenson  Procedure(s) Performed: Procedure(s): RIGHT SHOULDER ARTHROSCOPY  CAPSULORRHAPHY WITH DEBRIDEMENT EXTENTSIVE,  WITH REVISION ROTATOR CUFF REPAIR BANCHART REPAIR, REMOVAL FORIEGN BODY (Right)  Patient Location: PACU  Anesthesia Type:GA combined with regional for post-op pain  Level of Consciousness: awake, alert  and oriented  Airway and Oxygen Therapy: Patient Spontanous Breathing  Post-op Pain: none  Post-op Assessment: Post-op Vital signs reviewed  Post-op Vital Signs: Reviewed  Last Vitals:  Filed Vitals:   05/27/13 1340  BP: 132/76  Pulse: 70  Temp:   Resp: 20    Complications: No apparent anesthesia complications

## 2013-05-27 NOTE — H&P (Signed)
PREOPERATIVE H&P  Chief Complaint: Recurrent dislocation of shoulder  joint ]Articular cartilage disorder, shoulder region   HPI: Clinton Stevenson is a 43 y.o. male who presents for preoperative history and physical with a diagnosis of Recurrent dislocation of shoulder  Joint, Articular cartilage disorder, shoulder region . Symptoms are rated as moderate to severe, and have been worsening.  This is significantly impairing activities of daily living.  He has elected for surgical management. He has had to go to emergency room twice because of recurrent dislocations, and also has had a previous rotator cuff repair on that side, which based on recent MRI may have had recurrent tear.  Past Medical History  Diagnosis Date  . Shoulder dislocation     r   Past Surgical History  Procedure Laterality Date  . Rotator cuff surgery  2009    right  . Shoulder surgery Right     Rotator Cuff  . Laceration repair  2008    lip   History   Social History  . Marital Status: Married    Spouse Name: N/A    Number of Children: N/A  . Years of Education: N/A   Social History Main Topics  . Smoking status: Never Smoker   . Smokeless tobacco: Never Used  . Alcohol Use: Yes     Comment: occasionally  . Drug Use: No  . Sexual Activity: None   Other Topics Concern  . None   Social History Narrative  . None   History reviewed. No pertinent family history. No Known Allergies Prior to Admission medications   Medication Sig Start Date End Date Taking? Authorizing Provider  ALPRAZolam (XANAX) 0.25 MG tablet Take 0.25 mg by mouth 2 (two) times daily as needed for anxiety.  04/22/13   Historical Provider, MD  Ibuprofen-Diphenhydramine Cit (IBUPROFEN PM PO) Take 1-2 tablets by mouth daily as needed (for pain).    Historical Provider, MD  oxyCODONE-acetaminophen (PERCOCET/ROXICET) 5-325 MG per tablet Take 1 tablet by mouth every 6 (six) hours as needed for moderate pain or severe pain.    Historical  Provider, MD  sertraline (ZOLOFT) 50 MG tablet Take 50 mg by mouth daily.  04/22/13   Historical Provider, MD     Positive ROS: All other systems have been reviewed and were otherwise negative with the exception of those mentioned in the HPI and as above.  Physical Exam: General: Alert, no acute distress Cardiovascular: No pedal edema Respiratory: No cyanosis, no use of accessory musculature GI: No organomegaly, abdomen is soft and non-tender Skin: No lesions in the area of chief complaint Neurologic: Sensation intact distally Psychiatric: Patient is competent for consent with normal mood and affect Lymphatic: No axillary or cervical lymphadenopathy  MUSCULOSKELETAL: Left shoulder has full motion is normal strength. Right shoulder has positive apprehension, slight decreased sensation over the deltoid, all fingers do flex extend and abduct. MRI demonstrated evidence for recurrent rotator cuff tear, and also avulsion of the glenohumeral ligament from the humeral head, and also the inferior glenohumeral corner.   Assessment: Recurrent dislocation of shoulder  joint ]Articular cartilage disorder, shoulder region   Plan: Plan for Procedure(s): RIGHT SHOULDER ARTHROSCOPY  CAPSULORRHAPHY WITH DEBRIDEMENT EXTENTSIVE,DECOMPRESSION SUBACROMIAL PARTIAL ACROMIOPLASTY WITH ROTATOR CUFF REPAIR  The risks benefits and alternatives were discussed with the patient including but not limited to the risks of nonoperative treatment, versus surgical intervention including infection, bleeding, nerve injury,  blood clots, cardiopulmonary complications, morbidity, mortality, among others, and they were willing to proceed.   Ivin BootyJoshua  Robie Ridge, MD Cell [phone]   05/27/2013 7:20 AM

## 2013-05-27 NOTE — Transfer of Care (Deleted)
Immediate Anesthesia Transfer of Care Note  Patient: Clinton Stevenson  Procedure(s) Performed: Procedure(s): RIGHT SHOULDER ARTHROSCOPY  CAPSULORRHAPHY WITH DEBRIDEMENT EXTENTSIVE,DECOMPRESSION SUBACROMIAL PARTIAL ACROMIOPLASTY WITH ROTATOR CUFF REPAIR (Right)  Patient Location: PACU  Anesthesia Type:General and Regional  Level of Consciousness: awake and alert   Airway & Oxygen Therapy: Patient Spontanous Breathing and Patient connected to face mask oxygen  Post-op Assessment: Report given to PACU RN and Post -op Vital signs reviewed and stable  Post vital signs: Reviewed and stable  Complications: No apparent anesthesia complications

## 2013-05-27 NOTE — Progress Notes (Signed)
Assisted Dr. Crews with right, ultrasound guided, interscalene  block. Side rails up, monitors on throughout procedure. See vital signs in flow sheet. Tolerated Procedure well. 

## 2013-05-27 NOTE — Transfer of Care (Signed)
Immediate Anesthesia Transfer of Care Note  Patient: Clinton Stevenson  Procedure(s) Performed: Procedure(s): RIGHT SHOULDER ARTHROSCOPY  CAPSULORRHAPHY WITH DEBRIDEMENT EXTENTSIVE,  WITH REVISION ROTATOR CUFF REPAIR BANCHART REPAIR, REMOVAL FORIEGN BODY (Right)  Patient Location: PACU  Anesthesia Type:General and Regional  Level of Consciousness: awake, alert  and oriented  Airway & Oxygen Therapy: Patient Spontanous Breathing and Patient connected to face mask oxygen  Post-op Assessment: Report given to PACU RN and Post -op Vital signs reviewed and stable  Post vital signs: Reviewed and stable  Complications: No apparent anesthesia complications

## 2013-05-27 NOTE — Op Note (Signed)
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  ANESTHESIA:   General  PREOPERATIVE INDICATIONS:  Clinton Stevenson is a  43 y.o. male who had a rotator cuff repair about 9 years ago, who then had a shoulder dislocation recently, with closed reduction in the emergency room, followed by a second shoulder dislocation requiring emergency room reduction, with MRI evidence for recurrent rotator cuff tear, as well as anterior instability.  The risks benefits and alternatives were discussed with the patient preoperatively including but not limited to the risks of infection, bleeding, nerve injury, cardiopulmonary complications, the need for revision surgery, among others, and the patient was willing to proceed. We also discussed the risks for permanent loss of rotator cuff function, recurrent rotator cuff tear, recurrent instability, among others.  OPERATIVE IMPLANTS: Arthrex bio composite 2.9 mm short push lock anchors x1. I used a total of 1 #2 labral fiber tape in an inverted horizontal mattress configuration. For the cuff I used a 4.5 mm bio composite swivel lock posteriorly for the infraspinatus, and I used a 6.25 mm bio composite swivel lock with an inverted fiber tape and an inverted FiberWire anteriorly. I had to remove one of the previous anchors and replaced the anchor into that same hole, and I also removed multiple FiberWire sutures. A ended up discarding a 4.75  mm bio composite swivel lock, which did not hold anteriorly, and also discarding a 5.5 mm peek anchor which also did not hold.  OPERATIVE FINDINGS: He had a massive recurrent rotator cuff tear, with extremely poor quality tissue, and severe retraction, with anterior instability, no significant Hill-Sachs deformity that I can appreciate, biceps tendon was intact, glenohumeral articular cartilage is intact. Posterior labrum was intact. Subscapularis and biceps pulley was intact. The supraspinatus was delaminated, and retracted, and almost not mobile enough to reach the articular surface.  OPERATIVE PROCEDURE: The patient was brought to the operating room and placed in the supine position. General anesthesia was administered. IV antibiotics were given. General anesthesia was administered.   The upper extremity was examined and I could not actually dislocate him anteriorly.  He was prepped and draped in usual sterile fashion. The patient was in a semilateral decubitus position.  Time out was performed. Diagnostic arthroscopy was carried out the above-named findings.   I placed 2 anterior cannulas, and  used a rasp to prepare the anterior capsule, although the labrum was not stripped off of the glenoid.   I then used a suture passer to pass an inverted labral fiber tape on either side of the inferior anterior glenohumeral ligament. This had excellent purchase on the tissue.  I anchored the anterior inferior glenohumeral ligament into the glenoid using a push lock anchor.   I then went to the subacromial space, performed a bursectomy, and evaluate the integrity of the cuff. This is extremely poor quality, with severe retraction, and I performed a light acromioplasty, as there did seem to be some still residual subacromial spurring. After preparing the tuberosity, I placed an inverted posterior fiber tape using  a [employer] suture passer, and then placed an anterior horizontal mattress inverted fiber tape  anteriorly, along with an inverted #2 FiberWire through the very most suture for fascial anterior cuff overlying the biceps tendon.  I also removed the previous #2 FiberWire sutures, and found one of the anchors that was previously placed, but at this point was hoping to work around it. I placed a posterior anchor, with 4.75 mm bio composite swivel lock, and had excellent fixation of the posterior cuff.  I then attempted to place an anterior anchor, just behind the biceps, although the bone quality in this location is very poor, and it did not hold. I went up to a 5.5 mm swivel lock, but this still did not give me purchase.  Therefore, I was running a real estate on the tuberosity, and went back to remove the previous anchor, and removed this using a pituitary rongeur. I then found the tract of the previous anchor, and then placed a 6.25 swivel lock. I was finally able to achieve fixation. Having said that, the tendon itself barely made it back to the tuberosity, but I did have satisfactory position and secure fixation. The arthroscopic cannulas were removed, and the portals closed with Monocryl followed by Steri-Strips and sterile gauze. The patient was awakened and returned to the PACU in stable and satisfactory condition. There were no complications and the patient tolerated the procedure well.

## 2013-05-27 NOTE — Anesthesia Procedure Notes (Addendum)
Anesthesia Regional Block:  Interscalene brachial plexus block  Pre-Anesthetic Checklist: ,, timeout performed, Correct Patient, Correct Site, Correct Laterality, Correct Procedure, Correct Position, site marked, Risks and benefits discussed,  Surgical consent,  Pre-op evaluation,  At surgeon's request and post-op pain management  Laterality: Right and Upper  Prep: chloraprep       Needles:  Injection technique: Single-shot  Needle Type: Echogenic Needle     Needle Length: 5cm 5 cm Needle Gauge: 21 and 21 G    Additional Needles:  Procedures: ultrasound guided (picture in chart) Interscalene brachial plexus block Narrative:  Start time: 05/27/2013 8:25 AM End time: 05/27/2013 8:32 AM Injection made incrementally with aspirations every 5 mL.  Performed by: Personally  Anesthesiologist: Sheldon Silvanavid Crews, MD   Procedure Name: Intubation Date/Time: 05/27/2013 9:45 AM Performed by: Zenia ResidesPAYNE, Zriyah Kopplin D Pre-anesthesia Checklist: Patient identified, Emergency Drugs available, Suction available and Patient being monitored Patient Re-evaluated:Patient Re-evaluated prior to inductionOxygen Delivery Method: Circle System Utilized Preoxygenation: Pre-oxygenation with 100% oxygen Intubation Type: IV induction Ventilation: Mask ventilation without difficulty Laryngoscope Size: Mac and 3 Grade View: Grade I Tube type: Oral Number of attempts: 1 Airway Equipment and Method: stylet and oral airway Placement Confirmation: ETT inserted through vocal cords under direct vision,  positive ETCO2 and breath sounds checked- equal and bilateral Secured at: 23 cm Tube secured with: Tape Dental Injury: Teeth and Oropharynx as per pre-operative assessment

## 2013-05-27 NOTE — Addendum Note (Signed)
Addendum created 05/27/13 1410 by Ronnette HilaLinda D Candon Caras, CRNA   Modules edited: Charges VN

## 2013-05-27 NOTE — Anesthesia Preprocedure Evaluation (Signed)
Anesthesia Evaluation  Patient identified by MRN, date of birth, ID band Patient awake    Reviewed: Allergy & Precautions, H&P , NPO status , Patient's Chart, lab work & pertinent test results  Airway Mallampati: I  TM Distance: >3 FB Neck ROM: Full    Dental  (+) Teeth Intact, Dental Advisory Given   Pulmonary  breath sounds clear to auscultation        Cardiovascular Rhythm:Regular Rate:Normal     Neuro/Psych    GI/Hepatic   Endo/Other    Renal/GU      Musculoskeletal   Abdominal   Peds  Hematology   Anesthesia Other Findings   Reproductive/Obstetrics                             Anesthesia Physical Anesthesia Plan  ASA: I  Anesthesia Plan: General   Post-op Pain Management:    Induction: Intravenous  Airway Management Planned: Oral ETT  Additional Equipment:   Intra-op Plan:   Post-operative Plan: Extubation in OR  Informed Consent: I have reviewed the patients History and Physical, chart, labs and discussed the procedure including the risks, benefits and alternatives for the proposed anesthesia with the patient or authorized representative who has indicated his/her understanding and acceptance.   Dental advisory given  Plan Discussed with: CRNA and Anesthesiologist  Anesthesia Plan Comments:         Anesthesia Quick Evaluation  

## 2013-05-31 NOTE — Addendum Note (Signed)
Addendum created 05/31/13 0859 by Lance CoonWesley Edelmira Gallogly, CRNA   Modules edited: Charges VN

## 2015-06-15 ENCOUNTER — Emergency Department (HOSPITAL_COMMUNITY): Payer: BLUE CROSS/BLUE SHIELD

## 2015-06-15 ENCOUNTER — Emergency Department (HOSPITAL_COMMUNITY)
Admission: EM | Admit: 2015-06-15 | Discharge: 2015-06-16 | Disposition: A | Discharge status: Home / Self Care | Payer: BLUE CROSS/BLUE SHIELD | Attending: Emergency Medicine | Admitting: Emergency Medicine

## 2015-06-15 ENCOUNTER — Encounter (HOSPITAL_COMMUNITY): Payer: Self-pay

## 2015-06-15 DIAGNOSIS — M25511 Pain in right shoulder: Secondary | ICD-10-CM | POA: Diagnosis present

## 2015-06-15 DIAGNOSIS — Z87828 Personal history of other (healed) physical injury and trauma: Secondary | ICD-10-CM | POA: Diagnosis not present

## 2015-06-15 DIAGNOSIS — Z79899 Other long term (current) drug therapy: Secondary | ICD-10-CM | POA: Diagnosis not present

## 2015-06-15 DIAGNOSIS — Z9889 Other specified postprocedural states: Secondary | ICD-10-CM | POA: Insufficient documentation

## 2015-06-15 MED ORDER — FENTANYL CITRATE (PF) 100 MCG/2ML IJ SOLN
50.0000 ug | INTRAMUSCULAR | Status: DC | PRN
  Administered 2015-06-15: 50 ug via INTRAVENOUS
  Filled 2015-06-15: qty 2

## 2015-06-15 MED ORDER — OXYCODONE-ACETAMINOPHEN 5-325 MG PO TABS: 2.0000 | ORAL_TABLET | ORAL | Status: AC | PRN

## 2015-06-15 MED ORDER — METHOCARBAMOL 500 MG PO TABS: 500.0000 mg | ORAL_TABLET | Freq: Four times a day (QID) | ORAL | Status: AC

## 2015-06-15 MED ORDER — METHOCARBAMOL 500 MG PO TABS
500.0000 mg | ORAL_TABLET | Freq: Once | ORAL | Status: AC
  Administered 2015-06-16: 500 mg via ORAL
  Filled 2015-06-15: qty 1

## 2015-06-15 NOTE — ED Notes (Signed)
Pt moving to bed, states "It just popped back in!"

## 2015-06-15 NOTE — ED Provider Notes (Signed)
CSN: [id number]     Arrival date & time 06/15/15  2159 History   First MD Initiated Contact with Patient 06/15/15 2211     Chief Complaint  Patient presents with  . Shoulder Pain     (Consider location/radiation/quality/duration/timing/severity/associated sxs/prior Treatment) Patient is a [age] y.o. male presenting with shoulder pain. The history is provided by the patient. No language interpreter was used.  Shoulder Pain Location:  Shoulder Time since incident:  2 hours Injury: no   Shoulder location:  R shoulder Pain details:    Quality:  Aching   Radiates to:  R shoulder   Severity:  No pain   Timing:  Constant   Progression:  Worsening Chronicity:  New Dislocation: yes   Relieved by:  Nothing Worsened by:  Nothing tried Ineffective treatments:  None tried Associated symptoms: no back pain   Risk factors: no frequent fractures   Pt has had previous dislocations.  Pt has had 2 previous shoulder surgeries  Past Medical History  Diagnosis Date  . Shoulder dislocation     r  . Anterior dislocation of right shoulder 05/27/2013  . 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 Rotator cuff surgery  2009    right  . Shoulder surgery Right     Rotator Cuff  . Laceration repair  2008    lip   No family history on file. Social History  Substance Use Topics  . Smoking status: Never Smoker   . Smokeless tobacco: Never Used  . Alcohol Use: Yes     Comment: occasionally    Review of Systems  Musculoskeletal: Negative for back pain.  All other systems reviewed and are negative.     Allergies  Review of patient's allergies indicates no known allergies.  Home Medications   Prior to Admission medications   Medication Sig Start Date End Date Taking? Authorizing Provider  ALPRAZolam (XANAX) 0.25 MG tablet Take 0.25 mg by mouth 2 (two) times daily as needed for anxiety.  04/22/13  Yes Historical Provider, MD  ibuprofen  (ADVIL,MOTRIN) 200 MG tablet Take 600 mg by mouth every 6 (six) hours as needed for headache or moderate pain.   Yes Historical Provider, MD  methocarbamol (ROBAXIN) 500 MG tablet Take 1 tablet (500 mg total) by mouth 4 (four) times daily. 05/27/13   Teryl LucyJoshua Landau, MD  oxyCODONE-acetaminophen (PERCOCET) 10-325 MG per tablet Take 1-2 tablets by mouth every 6 (six) hours as needed for pain. MAXIMUM TOTAL ACETAMINOPHEN DOSE IS 4000 MG PER DAY 05/27/13   Teryl LucyJoshua Landau, MD  promethazine (PHENERGAN) 25 MG tablet Take 1 tablet (25 mg total) by mouth every 6 (six) hours as needed for nausea or vomiting. 05/27/13   Teryl LucyJoshua Landau, MD  sennosides-docusate sodium (SENOKOT-S) 8.6-50 MG tablet Take 2 tablets by mouth daily. 05/27/13   Teryl LucyJoshua Landau, MD   BP 134/80 mmHg  Pulse 65  Resp 22  SpO2 95% Physical Exam  Constitutional: He is oriented to person, place, and time. He appears well-developed and well-nourished.  HENT:  Head: Normocephalic.  Eyes: EOM are normal.  Neck: Normal range of motion.  Cardiovascular: Normal rate and normal heart sounds.   Pulmonary/Chest: Effort normal.  Abdominal: He exhibits no distension.  Musculoskeletal: He exhibits tenderness.  Tender right shoulder,  No deformity,  nv and ns intact  Neurological: He is alert and oriented to person, place, and time.  Psychiatric: He has a normal mood and affect.  Nursing note and vitals reviewed.   ED Course  Procedures (including critical care time) Labs Review Labs Reviewed - No data to display  Imaging Review Dg Shoulder Right  06/15/2015  CLINICAL DATA:  Acute onset of severe right shoulder pain after swatting a bug. Initial encounter. EXAM: RIGHT SHOULDER - 2+ VIEW COMPARISON:  Right shoulder radiographs performed 05/12/2013 FINDINGS: There is no evidence of fracture or dislocation. The right humeral head is seated within the glenoid fossa. The acromioclavicular joint is unremarkable in appearance. No significant soft tissue  abnormalities are seen. The visualized portions of the right lung are clear. IMPRESSION: No evidence of fracture or dislocation. Electronically Signed   By: Roanna Raider M.D.   On: 06/15/2015 23:01   I have personally reviewed and evaluated these images and lab results as part of my medical decision-making.   EKG Interpretation None      MDM   Final diagnoses:  Right shoulder pain   An After Visit Summary was printed and given to the patient. Meds ordered this encounter  Medications  . fentaNYL (SUBLIMAZE) injection 50 mcg    Sig:   . ibuprofen (ADVIL,MOTRIN) 200 MG tablet    Sig: Take 600 mg by mouth every 6 (six) hours as needed for headache or moderate pain.  . methocarbamol (ROBAXIN) tablet 500 mg    Sig:   . methocarbamol (ROBAXIN) 500 MG tablet    Sig: Take 1 tablet (500 mg total) by mouth 4 (four) times daily.    Dispense:  20 tablet    Refill:  0    Order Specific Question:  Supervising Provider    Answer:  MILLER, BRIAN [[id number]]  . oxyCODONE-acetaminophen (PERCOCET/ROXICET) 5-325 MG tablet    Sig: Take 2 tablets by mouth every 4 (four) hours as needed for severe pain.    Dispense:  15 tablet    Refill:  0    Order Specific Question:  Supervising Provider    Answer:  Eber Hong [[id number]]        Elson Areas, PA-C 06/16/15 0000

## 2015-06-15 NOTE — ED Notes (Signed)
Gabe RN came to triage to take patient to room assignment and states he will obtain pts vital signs and adm protocol pain medication once patient is in room.

## 2015-06-15 NOTE — Discharge Instructions (Signed)
Shoulder Dislocation °A shoulder dislocation happens when the upper arm bone (humerus) moves out of the shoulder joint. The shoulder joint is the part of the shoulder where the humerus, shoulder blade (scapula), and collarbone (clavicle) meet. °CAUSES °This condition is often caused by: °· A fall. °· A hit to the shoulder. °· A forceful movement of the shoulder. °RISK FACTORS °This condition is more likely to develop in people who play sports. °SYMPTOMS °Symptoms of this condition include: °· Deformity of the shoulder. °· Intense pain. °· Inability to move the shoulder. °· Numbness, weakness, or tingling in your neck or down your arm. °· Bruising or swelling around your shoulder. °DIAGNOSIS °This condition is diagnosed with a physical exam. After the exam, tests may be done to check for related problems. Tests that may be done include: °· X-ray. This may be done to check for broken bones. °· MRI. This may be done to check for damage to the tissues around the shoulder. °· Electromyogram. This may be done to check for nerve damage. °TREATMENT °This condition is treated with a procedure to place the humerus back in the joint. This procedure is called a reduction. There are two types of reduction: °· Closed reduction. In this procedure, the humerus is placed back in the joint without surgery. The health care provider uses his or her hands to guide the bone back into place. °· Open reduction. In this procedure, the humerus is placed back in the joint with surgery. An open reduction may be recommended if: °¨ You have a weak shoulder joint or weak ligaments. °¨ You have had more than one shoulder dislocation. °¨ The nerves or blood vessels around your shoulder have been damaged. °After the humerus is placed back into the joint, your arm will be placed in a splint or sling to prevent it from moving. You will need to wear the splint or sling until your shoulder heals. When the splint or sling is removed, you may have  physical therapy to help improve the range of motion in your shoulder joint. °HOME CARE INSTRUCTIONS °If You Have a Splint or Sling: °· Wear it as told by your health care provider. Remove it only as told by your health care provider. °· Loosen it if your fingers become numb and tingle, or if they turn cold and blue. °· Keep it clean and dry. °Bathing °· Do not take baths, swim, or use a hot tub until your health care provider approves. Ask your health care provider if you can take showers. You may only be allowed to take sponge baths for bathing. °· If your health care provider approves bathing and showering, cover your splint or sling with a watertight plastic bag to protect it from water. Do not let the splint or sling get wet. °Managing Pain, Stiffness, and Swelling °· If directed, apply ice to the injured area. °¨ Put ice in a plastic bag. °¨ Place a towel between your skin and the bag. °¨ Leave the ice on for 20 minutes, 2-3 times per day. °· Move your fingers often to avoid stiffness and to decrease swelling. °· Raise (elevate) the injured area above the level of your heart while you are sitting or lying down. °Driving °· Do not drive while wearing a splint or sling on a hand that you use for driving. °· Do not drive or operate heavy machinery while taking pain medicine. °Activity °· Return to your normal activities as told by your health care provider. Ask your   health care provider what activities are safe for you. °· Perform range-of-motion exercises only as told by your health care provider. °· Exercise your hand by squeezing a soft ball. This helps to decrease stiffness and swelling in your hand and wrist. °General Instructions °· Take over-the-counter and prescription medicines only as told by your health care provider. °· Do not use any tobacco products, including cigarettes, chewing tobacco, or e-cigarettes. Tobacco can delay bone and tissue healing. If you need help quitting, ask your health care  provider. °· Keep all follow-up visits as told by your health care provider. This is important. °SEEK MEDICAL CARE IF: °· Your splint or sling gets damaged. °SEEK IMMEDIATE MEDICAL CARE IF: °· Your pain gets worse rather than better. °· You lose feeling in your arm or hand. °· Your arm or hand becomes white and cold. °  °This information is not intended to replace advice given to you by your health care provider. Make sure you discuss any questions you have with your health care provider. °  °Document Released: 10/29/2000 Document Revised: 10/25/2014 Document Reviewed: 05/29/2014 °Elsevier Interactive Patient Education ©2016 [employer] ° °

## 2015-06-15 NOTE — ED Notes (Signed)
Pt reports right shoulder pain after swinging to hit a bee. Hx of shoulder injuries and surgeries and thinks it may be dislocated. Pt tearful at triage.

## 2015-08-30 NOTE — ED Provider Notes (Signed)
Medical screening examination/treatment/procedure(s) were performed by non-physician practitioner and as supervising physician I was immediately available for consultation/collaboration.   EKG Interpretation None       Jacalyn LefevreJulie Denell Cothern, MD 08/30/15 [phone]

## 2016-11-15 ENCOUNTER — Emergency Department (HOSPITAL_COMMUNITY): Payer: Managed Care, Other (non HMO)

## 2016-11-15 ENCOUNTER — Emergency Department (HOSPITAL_COMMUNITY)
Admission: EM | Admit: 2016-11-15 | Discharge: 2016-11-15 | Disposition: A | Discharge status: Home / Self Care | Payer: Managed Care, Other (non HMO) | Attending: Emergency Medicine | Admitting: Emergency Medicine

## 2016-11-15 ENCOUNTER — Encounter (HOSPITAL_COMMUNITY): Payer: Self-pay | Admitting: Emergency Medicine

## 2016-11-15 DIAGNOSIS — Z79899 Other long term (current) drug therapy: Secondary | ICD-10-CM | POA: Diagnosis not present

## 2016-11-15 DIAGNOSIS — S4991XA Unspecified injury of right shoulder and upper arm, initial encounter: Secondary | ICD-10-CM | POA: Diagnosis present

## 2016-11-15 DIAGNOSIS — X509XXA Other and unspecified overexertion or strenuous movements or postures, initial encounter: Secondary | ICD-10-CM | POA: Diagnosis not present

## 2016-11-15 DIAGNOSIS — S43014A Anterior dislocation of right humerus, initial encounter: Secondary | ICD-10-CM | POA: Diagnosis not present

## 2016-11-15 DIAGNOSIS — Y929 Unspecified place or not applicable: Secondary | ICD-10-CM | POA: Diagnosis not present

## 2016-11-15 DIAGNOSIS — Y999 Unspecified external cause status: Secondary | ICD-10-CM | POA: Diagnosis not present

## 2016-11-15 DIAGNOSIS — Y9301 Activity, walking, marching and hiking: Secondary | ICD-10-CM | POA: Insufficient documentation

## 2016-11-15 MED ORDER — OXYCODONE-ACETAMINOPHEN 5-325 MG PO TABS
ORAL_TABLET | ORAL | Status: AC
  Filled 2016-11-15: qty 1

## 2016-11-15 MED ORDER — MORPHINE SULFATE (PF) 4 MG/ML IV SOLN
6.0000 mg | Freq: Once | INTRAVENOUS | Status: AC
  Administered 2016-11-15: 6 mg via INTRAMUSCULAR
  Filled 2016-11-15: qty 2

## 2016-11-15 MED ORDER — DIAZEPAM 5 MG PO TABS
10.0000 mg | ORAL_TABLET | Freq: Once | ORAL | Status: AC
  Administered 2016-11-15: 10 mg via ORAL
  Filled 2016-11-15: qty 2

## 2016-11-15 MED ORDER — OXYCODONE-ACETAMINOPHEN 5-325 MG PO TABS
1.0000 | ORAL_TABLET | Freq: Once | ORAL | Status: AC
  Administered 2016-11-15: 1 via ORAL

## 2016-11-15 MED ORDER — SODIUM CHLORIDE 0.9 % IV BOLUS (SEPSIS)
500.0000 mL | Freq: Once | INTRAVENOUS | Status: AC
  Administered 2016-11-15: 500 mL via INTRAVENOUS

## 2016-11-15 MED ORDER — PROPOFOL 10 MG/ML IV BOLUS
INTRAVENOUS | Status: AC | PRN
  Administered 2016-11-15: 30 mg via INTRAVENOUS
  Administered 2016-11-15: 40 mg via INTRAVENOUS
  Administered 2016-11-15: 30 mg via INTRAVENOUS

## 2016-11-15 MED ORDER — PROPOFOL 10 MG/ML IV BOLUS
INTRAVENOUS | Status: AC
  Filled 2016-11-15: qty 20

## 2016-11-15 NOTE — ED Provider Notes (Signed)
[facility]-EMERGENCY DEPT Provider Note   CSN: [id number] Arrival date & time: 11/15/16  1350     History   Chief Complaint Chief Complaint  Patient presents with  . Shoulder Pain  HPI  Mr. Clinton Stevenson is a 46 year old male with past medical history of 2 right rotator cuff tears and prior right shoulder dislocation who presents with shoulder pain. He was pulled down the stairs by his dog while walking last night at around 10 PM. He denies head trauma or loss of consciousness. He laid down and heard a series of small pops and thought that his shoulder had reduced on its own. He went to sleep and woke up this morning and continue to have pain, so he came to the emergency room. He endorses numbness in his fingers hand and forearm. He is able to move his fingers and wrist and elbow. Moving his shoulder hurts. He took some advil which helped a little bit. He received Percocet in triage, which provided some relief.  He has had 2 surgeries for right rotator cuff tears in the past. He states that his rotator cuff tore again and is following with Dr. Dion Saucier for this. He has had a prior right shoulder dislocation and came to the emergency room. He states that upon getting into the emergency room bed, his shoulder reduced on its own.   He denies any known drug allergies.  Past Medical History:  Diagnosis Date  . Anterior dislocation of right shoulder 05/27/2013  . 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 Shoulder dislocation    r    Patient Active Problem List   Diagnosis Date Noted  . Anterior dislocation of right shoulder 05/27/2013  . 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 LACERATION REPAIR  2008   lip  . rotator cuff surgery  2009   right  . SHOULDER SURGERY Right    Rotator Cuff     Home Medications    Prior to Admission medications   Medication Sig Start Date End Date Taking? Authorizing Provider  ALPRAZolam (XANAX)  0.25 MG tablet Take 0.25 mg by mouth 2 (two) times daily as needed for anxiety.  04/22/13   [provider]  ibuprofen (ADVIL,MOTRIN) 200 MG tablet Take 600 mg by mouth every 6 (six) hours as needed for headache or moderate pain.    [provider]  methocarbamol (ROBAXIN) 500 MG tablet Take 1 tablet (500 mg total) by mouth 4 (four) times daily. 06/15/15   Elson Areas, PA-C  oxyCODONE-acetaminophen (PERCOCET/ROXICET) 5-325 MG tablet Take 2 tablets by mouth every 4 (four) hours as needed for severe pain. 06/15/15   Elson Areas, PA-C  promethazine (PHENERGAN) 25 MG tablet Take 1 tablet (25 mg total) by mouth every 6 (six) hours as needed for nausea or vomiting. 05/27/13   Teryl Lucy, MD  sennosides-docusate sodium (SENOKOT-S) 8.6-50 MG tablet Take 2 tablets by mouth daily. 05/27/13   Teryl Lucy, MD    Family History No family history on file.  Social History Social History  Substance Use Topics  . Smoking status: Never Smoker  . Smokeless tobacco: Never Used  . Alcohol use Yes     Comment: occasionally     Allergies   Patient has no known allergies.   Review of Systems Review of Systems Unremarkable except as stated in history of present illness.  Physical Exam Updated Vital Signs BP 133/77   Pulse Marland Kitchen)  59   Temp 98.7 F (37.1 C) (Oral)   Resp 14   SpO2 99%   Physical Exam GEN: Well-appearing, well-nourished. Alert and oriented. Appears uncomfortable holding right shoulder externally rotated. RESP: Clear to auscultation bilaterally. No wheezes, rales, or rhonchi. No increased work of breathing. CV: Normal rate and regular rhythm. No murmurs, gallops, or rubs. No LE edema. ABD: Soft. Non-tender. Non-distended. Normoactive bowel sounds. MSK: Right shoulder adducted and externally rotated. Able to wiggle fingers, move wrist and elbow. Shoulder ROM limited 2/2 pain. Numbness in fingers, hand, and forearm on right. 2+ radial pulses bilaterally EXT: No  edema. Warm and well perfused. NEURO: Cranial nerves II-XII grossly intact. Speech fluent and appropriate. PSYCH: Patient is calm and pleasant. Appropriate affect. Well-groomed; speech is appropriate and on-subject.  ED Treatments / Results  Labs (all labs ordered are listed, but only abnormal results are displayed) Labs Reviewed - No data to display  EKG  EKG Interpretation None       Radiology Dg Shoulder Right  Result Date: 11/15/2016 CLINICAL DATA:  Right shoulder pain after the patient's dog pulled really hard on the leash last night. EXAM: RIGHT SHOULDER - 2+ VIEW COMPARISON:  06/15/2015. FINDINGS: Interval anterior dislocation of the humeral head with an impaction fracture of the greater tuberosity and small fracture fragment lateral to the upper glenoid. Mild glenohumeral spur formation. IMPRESSION: 1. Anterior dislocation with a Hill-Sachs greater tuberosity fracture and small fracture fragment lateral to the upper glenoid. 2. Mild glenohumeral joint degenerative changes. Electronically Signed   By: Beckie Salts M.D.   On: 11/15/2016 15:24   Dg Shoulder Right Portable  Result Date: 11/15/2016 CLINICAL DATA:  Status post reduction of a right shoulder dislocation. EXAM: PORTABLE RIGHT SHOULDER COMPARISON:  Earlier today. FINDINGS: The previously demonstrated anterior dislocation has been reduced. The Hill-Sachs fracture is not as well visualized. IMPRESSION: Status post reduction of the previously seen anterior dislocation. Electronically Signed   By: Beckie Salts M.D.   On: 11/15/2016 18:32    Procedures Reduction of dislocation Date/Time: 11/15/2016 6:41 PM Performed by: Scherrie Gerlach Authorized by: Blane Ohara  Consent: Verbal consent obtained. Risks and benefits: risks, benefits and alternatives were discussed Consent given by: patient Patient understanding: patient states understanding of the procedure being performed Patient consent: the patient's understanding of  the procedure matches consent given Procedure consent: procedure consent matches procedure scheduled Relevant documents: relevant documents present and verified Test results: test results available and properly labeled Imaging studies: imaging studies available Patient identity confirmed: verbally with patient Time out: Immediately prior to procedure a "time out" was called to verify the correct patient, procedure, equipment, support staff and site/side marked as required. Preparation: Patient was prepped and draped in the usual sterile fashion. Local anesthesia used: no  Anesthesia: Local anesthesia used: no  Sedation: Patient sedated: yes Sedation type: moderate (conscious) sedation Sedatives: propofol Sedation start date/time: 11/15/2016 5:38 PM Sedation end date/time: 11/15/2016 5:58 PM Vitals: Vital signs were monitored during sedation. Patient tolerance: Patient tolerated the procedure well with no immediate complications Comments: Reduction of right anterior shoulder dislocation    (including critical care time)  Medications Ordered in ED Medications  oxyCODONE-acetaminophen (PERCOCET/ROXICET) 5-325 MG per tablet 1 tablet (1 tablet Oral Given 11/15/16 1415)  diazepam (VALIUM) tablet 10 mg (10 mg Oral Given 11/15/16 1620)  morphine 4 MG/ML injection 6 mg (6 mg Intramuscular Given 11/15/16 1619)  sodium chloride 0.9 % bolus 500 mL (0 mLs Intravenous Stopped 11/15/16 1815)  propofol (  DIPRIVAN) 10 mg/mL bolus/IV push (30 mg Intravenous Given 11/15/16 1743)     Initial Impression / Assessment and Plan / ED Course  I have reviewed the triage vital signs and the nursing notes.  Pertinent labs & imaging results that were available during my care of the patient were reviewed by me and considered in my medical decision making (see chart for details).  Mr. Letarte is a 46 year old male with past medical history of right rotator cuff tears status post repair and prior right anterior  shoulder dislocation who presents with recurrent right anterior shoulder dislocation since last night at around 10 PM. X-ray with anterior shoulder dislocation of right shoulder and Hill-Sachs fracture. Numbness in right forearm and hand and pain with movement of shoulder. 2+ radial pulses. Patient already received one dose of percocet. Will give valium and IV morphine and attempt reduction.  Reduction attempted after valium and morphine given. Unable to reduce. Will sedate with propofol and re-attempt reduction.  5:53pm Reduction procedure with propofol by Dr. Jodi Mourning, confirmed by post-reduction x-ray. Patient advised to follow-up with Ortho, to limit lifting, and return precautions provided. Patient stable for discharge.  Patient seen and discussed with Dr. Jodi Mourning.  Final Clinical Impressions(s) / ED Diagnoses   Final diagnoses:  Anterior dislocation of right shoulder, initial encounter    New Prescriptions New Prescriptions   No medications on file     Scherrie Gerlach, MD 11/15/16 Vinnie Langton    Blane Ohara, MD 11/16/16 [phone]

## 2016-11-15 NOTE — ED Triage Notes (Signed)
Daughter/daughter stated, I was pulled down by a dog while walking.

## 2016-11-15 NOTE — Discharge Instructions (Signed)
Follow-up with orthopedic surgery. Continue to wear shoulder sling and no lifting until cleared by specialist. Take Tylenol and Motrin as needed for pain.

## 2016-11-15 NOTE — ED Notes (Signed)
Conscious sedation started

## 2016-11-15 NOTE — ED Notes (Signed)
Dr Siri Cole attempted to reduce shoulder without success. Will move to TR and used sedation

## 2017-12-31 ENCOUNTER — Emergency Department (HOSPITAL_COMMUNITY): Payer: 59

## 2017-12-31 ENCOUNTER — Encounter (HOSPITAL_COMMUNITY): Payer: Self-pay

## 2017-12-31 ENCOUNTER — Emergency Department (HOSPITAL_COMMUNITY)
Admission: EM | Admit: 2017-12-31 | Discharge: 2017-12-31 | Disposition: A | Discharge status: Home / Self Care | Payer: 59 | Attending: Emergency Medicine | Admitting: Emergency Medicine

## 2017-12-31 DIAGNOSIS — Y939 Activity, unspecified: Secondary | ICD-10-CM | POA: Insufficient documentation

## 2017-12-31 DIAGNOSIS — X58XXXA Exposure to other specified factors, initial encounter: Secondary | ICD-10-CM | POA: Diagnosis not present

## 2017-12-31 DIAGNOSIS — S43004A Unspecified dislocation of right shoulder joint, initial encounter: Secondary | ICD-10-CM | POA: Insufficient documentation

## 2017-12-31 DIAGNOSIS — Y999 Unspecified external cause status: Secondary | ICD-10-CM | POA: Insufficient documentation

## 2017-12-31 DIAGNOSIS — Z79899 Other long term (current) drug therapy: Secondary | ICD-10-CM | POA: Diagnosis not present

## 2017-12-31 DIAGNOSIS — Y929 Unspecified place or not applicable: Secondary | ICD-10-CM | POA: Insufficient documentation

## 2017-12-31 MED ORDER — HYDROMORPHONE HCL 1 MG/ML IJ SOLN
1.0000 mg | Freq: Once | INTRAMUSCULAR | Status: AC
  Administered 2017-12-31: 1 mg via INTRAVENOUS
  Filled 2017-12-31: qty 1

## 2017-12-31 MED ORDER — PROPOFOL 10 MG/ML IV BOLUS
INTRAVENOUS | Status: AC | PRN
  Administered 2017-12-31: 40 mg via INTRAVENOUS

## 2017-12-31 MED ORDER — PROPOFOL 10 MG/ML IV BOLUS
0.5000 mg/kg | Freq: Once | INTRAVENOUS | Status: AC
  Administered 2017-12-31: 40.8 mg via INTRAVENOUS
  Filled 2017-12-31: qty 20

## 2017-12-31 NOTE — Progress Notes (Signed)
RT at bedside for conscience sedation procedure.  Airway cart available if needed. Pt placed on [place] with ETCO2 monitoring.

## 2017-12-31 NOTE — Discharge Instructions (Signed)
1.  Call Dr. Shelba FlakeLandau's office today to schedule follow-up appointment.

## 2017-12-31 NOTE — ED Triage Notes (Signed)
Pt presents for evaluation of right shoulder dislocation , hx of rotator cuff injuries and multiple dislocations. Last was one year ago. Pt denies injury or trauma.

## 2017-12-31 NOTE — ED Provider Notes (Addendum)
[facility]   CSN: [id number] Arrival date & time: 12/31/17  0741     History   Chief Complaint Chief Complaint  Patient presents with  . Shoulder Pain    HPI Clinton Stevenson is a 47 y.o. male.  HPI Patient reports multiple episodes of right shoulder dislocation.  He has had surgical interventions in the past.  He reports that he was in bed and moved a certain position in the right shoulder dislocated.  It happened approximately an hour and a half prior to arrival.  No other associated trauma.  Patient denies any other associated medical problems.  He reports that he last ate greater than 12 hours ago.  He reports he is otherwise healthy. Past Medical History:  Diagnosis Date  . Anterior dislocation of right shoulder 05/27/2013  . 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 Shoulder dislocation    r    Patient Active Problem List   Diagnosis Date Noted  . Anterior dislocation of right shoulder 05/27/2013  . 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 LACERATION REPAIR  2008   lip  . rotator cuff surgery  2009   right  . SHOULDER SURGERY Right    Rotator Cuff        Home Medications    Prior to Admission medications   Medication Sig Start Date End Date Taking? Authorizing Provider  ALPRAZolam (XANAX) 0.25 MG tablet Take 0.25 mg by mouth 2 (two) times daily as needed for anxiety.  04/22/13   [provider]  ibuprofen (ADVIL,MOTRIN) 200 MG tablet Take 600 mg by mouth every 6 (six) hours as needed for headache or moderate pain.    [provider]  methocarbamol (ROBAXIN) 500 MG tablet Take 1 tablet (500 mg total) by mouth 4 (four) times daily. 06/15/15   Elson AreasSofia, Leslie K, PA-C  oxyCODONE-acetaminophen (PERCOCET/ROXICET) 5-325 MG tablet Take 2 tablets by mouth every 4 (four) hours as needed for severe pain. 06/15/15   Elson AreasSofia, Leslie K, PA-C    promethazine (PHENERGAN) 25 MG tablet Take 1 tablet (25 mg total) by mouth every 6 (six) hours as needed for nausea or vomiting. 05/27/13   Teryl LucyLandau, Joshua, MD  sennosides-docusate sodium (SENOKOT-S) 8.6-50 MG tablet Take 2 tablets by mouth daily. 05/27/13   Teryl LucyLandau, Joshua, MD    Family History No family history on file.  Social History Social History   Tobacco Use  . Smoking status: Never Smoker  . Smokeless tobacco: Never Used  Substance Use Topics  . Alcohol use: Yes    Comment: occasionally  . Drug use: No     Allergies   Patient has no known allergies.   Review of Systems Review of Systems 10 Systems reviewed and are negative for acute change except as noted in the HPI.   Physical Exam Updated Vital Signs BP 119/76   Pulse 60   Temp 97.8 F (36.6 C) (Oral)   Resp 17   Wt 81.6 kg   SpO2 97%   BMI 29.05 kg/m   Physical Exam  Constitutional: He is oriented to person, place, and time.  Patient is in pain.  He is alert and nontoxic.  Otherwise clinically well in appearance.  HENT:  Head: Normocephalic and atraumatic.  Airway widely patent.  Eyes: EOM are normal.  Neck: Neck supple.  Cardiovascular: Normal rate, regular rhythm, normal heart sounds and intact distal pulses.  Pulmonary/Chest: Effort normal and breath sounds normal.  Musculoskeletal:  Right shoulder has appearance of subluxation and asymmetry.  Patient is neurovascularly intact.  Neurological: He is alert and oriented to person, place, and time. He exhibits normal muscle tone. Coordination normal.  Skin: Skin is warm and dry.  Psychiatric: He has a normal mood and affect.     ED Treatments / Results  Labs (all labs ordered are listed, but only abnormal results are displayed) Labs Reviewed - No data to display  EKG None  Radiology Dg Shoulder Right Portable  Result Date: 12/31/2017 CLINICAL DATA:  Acute RIGHT shoulder pain.  Initial encounter. EXAM: PORTABLE RIGHT SHOULDER COMPARISON:   11/15/2016 and prior radiographs FINDINGS: INFERIOR dislocation of the humeral head is noted. 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 Small bony density along the glenoid fossa is unchanged. IMPRESSION: INFERIOR dislocation of the humeral head with Hill-Sachs deformity again noted Electronically Signed   By: Harmon Pier M.D.   On: 12/31/2017 08:41    Procedures .Ortho Injury Treatment Date/Time: 12/31/2017 7:54 AM Performed by: Arby Barrette, MD Authorized by: Arby Barrette, MD   Consent:    Consent obtained:  Verbal   Consent given by:  Patient   Risks discussed:  Irreducible dislocation, vascular damage and nerve damage   Alternatives discussed:  Alternative treatmentInjury location: shoulder Location details: right shoulder Injury type: dislocation Dislocation type: inferior Chronicity: recurrent Pre-procedure neurovascular assessment: neurovascularly intact Pre-procedure distal perfusion: normal Pre-procedure neurological function: normal Pre-procedure range of motion: reduced  Patient sedated: Yes. Refer to sedation procedure documentation for details of sedation. Manipulation performed: yes Reduction method: external rotation Reduction successful: yes X-ray confirmed reduction: yes Immobilization: sling Post-procedure neurovascular assessment: post-procedure neurovascularly intact Post-procedure distal perfusion: normal Post-procedure neurological function: normal Post-procedure range of motion: normal Patient tolerance: Patient tolerated the procedure well with no immediate complications  .Sedation Date/Time: 12/31/2017 8:30 AM Performed by: Arby Barrette, MD Authorized by: Arby Barrette, MD   Consent:    Consent obtained:  Verbal   Consent given by:  Patient   Risks discussed:  Allergic reaction, dysrhythmia, inadequate sedation, nausea, prolonged hypoxia resulting in organ damage, prolonged sedation necessitating reversal, respiratory compromise  necessitating ventilatory assistance and intubation and vomiting   Alternatives discussed:  Analgesia without sedation, anxiolysis and regional anesthesia Universal protocol:    Procedure explained and questions answered to patient or proxy's satisfaction: yes     Relevant documents present and verified: yes     Test results available and properly labeled: yes     Imaging studies available: yes     Required blood products, implants, devices, and special equipment available: yes     Site/side marked: yes     Immediately prior to procedure a time out was called: yes     Patient identity confirmation method:  Verbally with patient Indications:    Procedure necessitating sedation performed by:  Physician performing sedation Pre-sedation assessment:    Time since last food or drink:  6pm   ASA classification: class 1 - normal, healthy patient     Neck mobility: normal     Mouth opening:  3 or more finger widths   Thyromental distance:  4 finger widths   Mallampati score:  I - soft palate, uvula, fauces, pillars visible   Pre-sedation assessments completed and reviewed: airway patency, cardiovascular function, hydration status, mental status, nausea/vomiting, pain level, respiratory function and temperature   Immediate pre-procedure details:    Reassessment: Patient reassessed immediately prior to procedure  Reviewed: vital signs, relevant labs/tests and NPO status     Verified: bag valve mask available, emergency equipment available, intubation equipment available, IV patency confirmed, oxygen available and suction available   Procedure details (see MAR for exact dosages):    Preoxygenation:  Nasal cannula   Sedation:  Propofol   Intra-procedure monitoring:  Blood pressure monitoring, cardiac monitor, continuous pulse oximetry, frequent LOC assessments, frequent vital sign checks and continuous capnometry   Intra-procedure events: none     Total Provider sedation time (minutes):   20 Post-procedure details:    Post-sedation assessment completed:  12/31/2017 8:31 AM   Attendance: Constant attendance by certified staff until patient recovered     Recovery: Patient returned to pre-procedure baseline     Post-sedation assessments completed and reviewed: airway patency, cardiovascular function, hydration status, mental status, nausea/vomiting, pain level, respiratory function and temperature     Patient is stable for discharge or admission: yes     Patient tolerance:  Tolerated well, no immediate complications   (including critical care time)  Medications Ordered in ED Medications  propofol (DIPRIVAN) 10 mg/mL bolus/IV push 40.8 mg (40.8 mg Intravenous Given 12/31/17 0822)  HYDROmorphone (DILAUDID) injection 1 mg (1 mg Intravenous Given 12/31/17 0801)  propofol (DIPRIVAN) 10 mg/mL bolus/IV push (40 mg Intravenous Given 12/31/17 0823)     Initial Impression / Assessment and Plan / ED Course  I have reviewed the triage vital signs and the nursing notes.  Pertinent labs & imaging results that were available during my care of the patient were reviewed by me and considered in my medical decision making (see chart for details).       Postreduction x-ray reviewed by myself.  Shoulder is in position and appropriately relocated.  Patient was spontaneous and recurrent shoulder dislocation.  Patient did not tolerate manipulation without sedation.  Sedation with propofol.  Shoulder reduced very easily under sedation.  No other associated problems today.  Patient will have to follow-up with Dr. Dion Saucier for ongoing planning of management of recurrent dislocation.  Final Clinical Impressions(s) / ED Diagnoses   Final diagnoses:  Closed dislocation of right shoulder, initial encounter    ED Discharge Orders    None       Arby Barrette, MD 12/31/17 1610    Arby Barrette, MD 12/31/17 [phone]

## 2017-12-31 NOTE — Progress Notes (Signed)
Pt's vitals after procedure are stable.  RN and family at bedside.

## 2018-01-01 ENCOUNTER — Other Ambulatory Visit: Payer: Self-pay | Admitting: Orthopedic Surgery

## 2018-01-01 DIAGNOSIS — M25511 Pain in right shoulder: Secondary | ICD-10-CM

## 2018-01-04 ENCOUNTER — Other Ambulatory Visit: Payer: Self-pay | Admitting: Orthopedic Surgery

## 2018-01-04 DIAGNOSIS — M25511 Pain in right shoulder: Secondary | ICD-10-CM

## 2018-01-06 ENCOUNTER — Ambulatory Visit
Admission: RE | Admit: 2018-01-06 | Discharge: 2018-01-06 | Disposition: A | Discharge status: Home / Self Care | Payer: 59 | Source: Ambulatory Visit | Attending: Orthopedic Surgery | Admitting: Orthopedic Surgery

## 2018-01-06 ENCOUNTER — Other Ambulatory Visit: Payer: 59

## 2018-01-06 DIAGNOSIS — M25511 Pain in right shoulder: Secondary | ICD-10-CM

## 2018-01-13 ENCOUNTER — Other Ambulatory Visit: Payer: 59

## 2019-03-02 ENCOUNTER — Other Ambulatory Visit: Payer: Self-pay | Admitting: Orthopedic Surgery

## 2019-03-02 DIAGNOSIS — M25511 Pain in right shoulder: Secondary | ICD-10-CM

## 2019-06-16 IMAGING — CT CT SHOULDER*R* W/O CM
4 of 6 series · 14 of 33 positions shown, 16 images · non-contrast
Comparison: None.

CLINICAL DATA: Significant right shoulder pain. History of rotator
cuff repair x2 with dislocations. Dislocation this past [REDACTED].

EXAM:
CT OF THE UPPER RIGHT EXTREMITY WITHOUT CONTRAST
TECHNIQUE: Multidetector CT imaging of the upper right extremity was performed
according to the standard protocol.

[Series 2: shoulder 2.00 br40 s3 ax · axial · 0.48mm/px · z∈[-810,-682]mm · 5 of 97 slices shown, 7 images]
[im 17/97  soft-tissue]
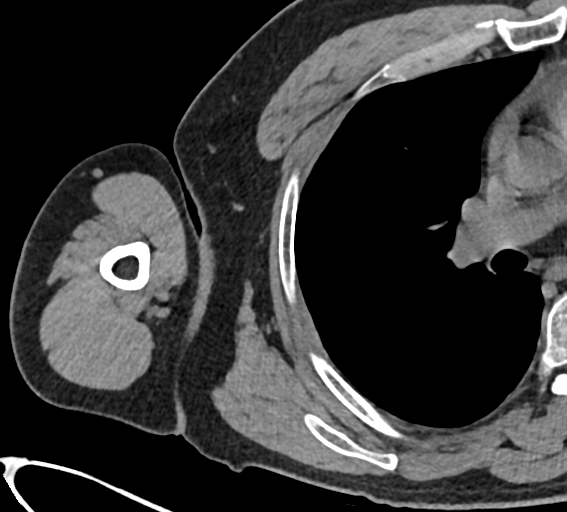
[im 17/97  bone]
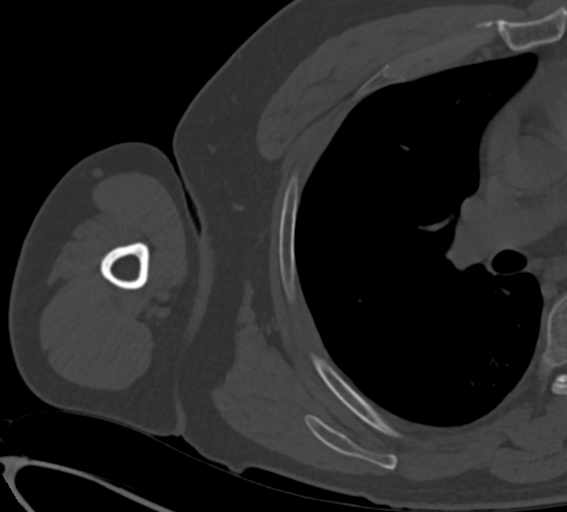
[im 33/97  bone]
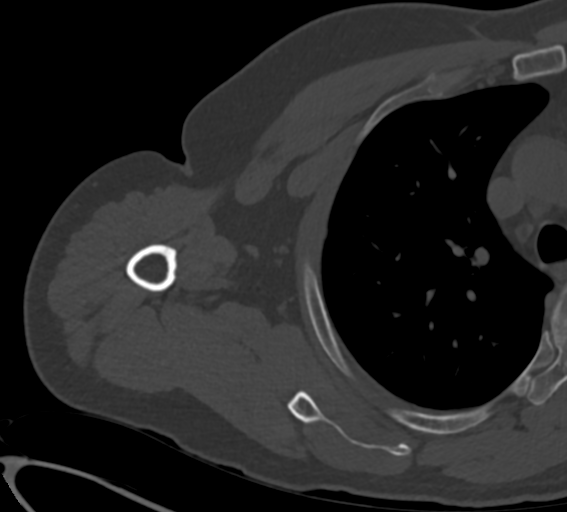
[im 49/97  bone]
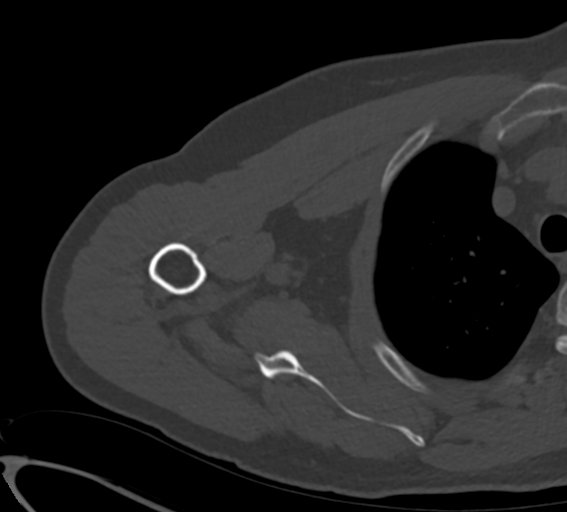
[im 65/97  bone]
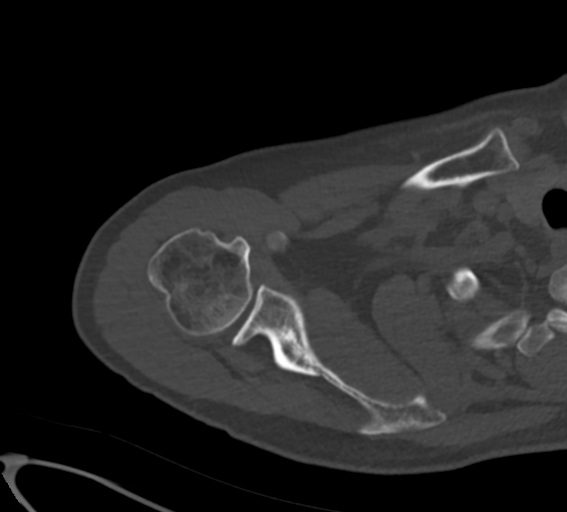
[im 81/97  soft-tissue]
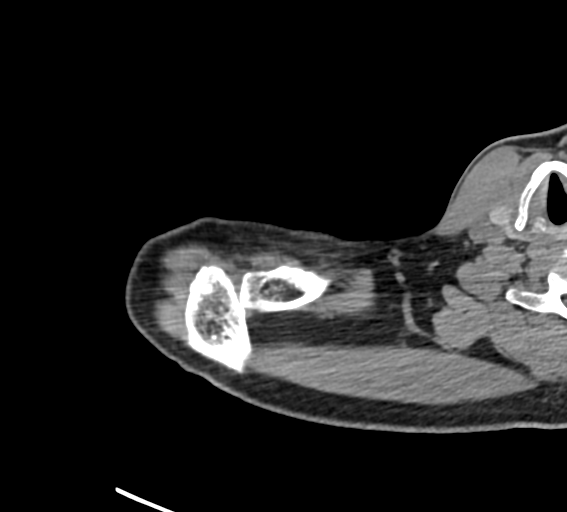
[im 81/97  bone]
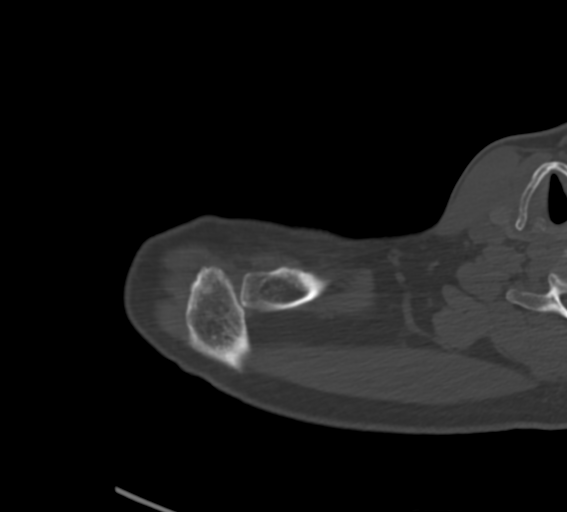

[Series 4: shoulder 2.00 br60 s3 ax · axial · 0.48mm/px · z∈[-810,-746]mm · 3 of 97 slices shown]
[im 17/97  bone]
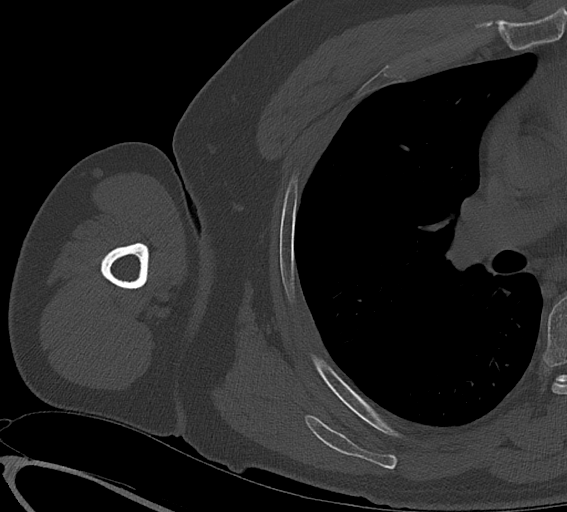
[im 33/97  bone]
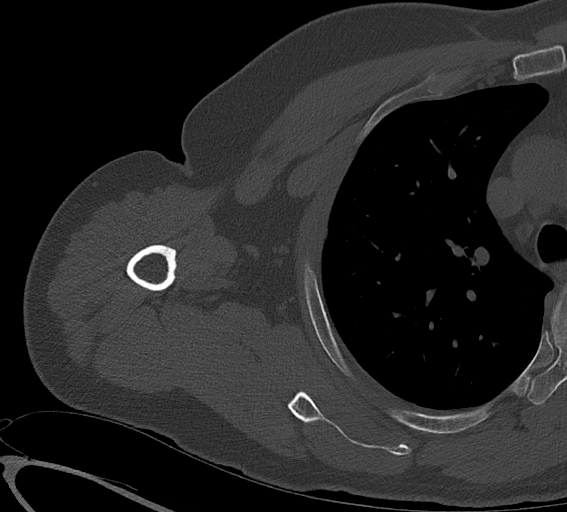
[im 49/97  bone]
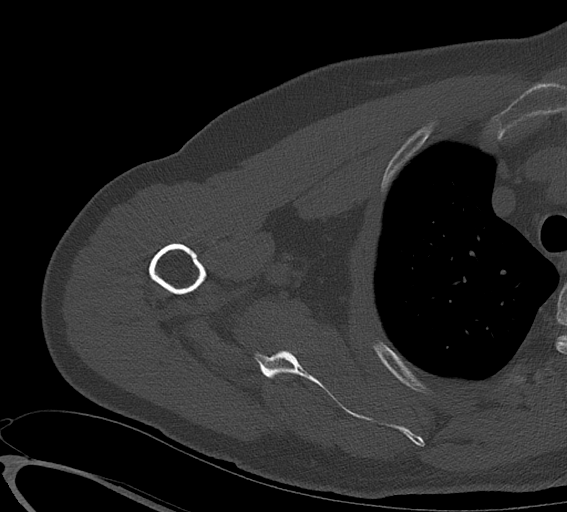

[Series 6: shoulder 2.00 br40 s3 cor · coronal · 0.38mm/px · 1 of 102 slices shown]
[im 51/102  bone]
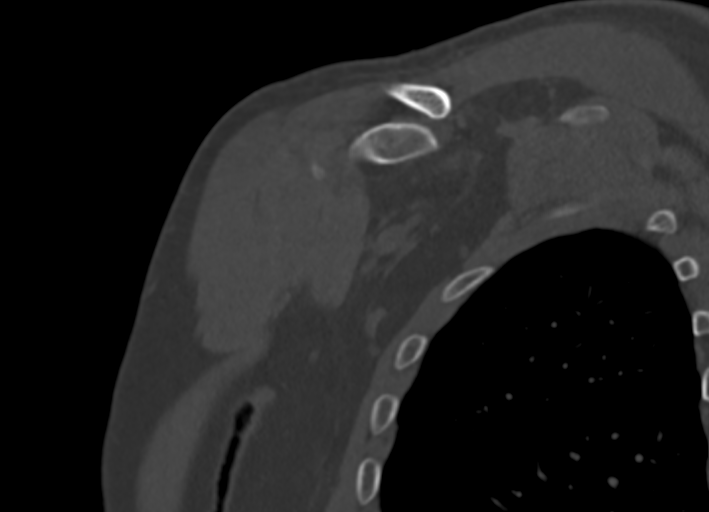

[Series 10: shoulder 2.00 br40 s3 sag · sagittal · 0.38mm/px · 5 of 116 slices shown]
[im 20/116  bone]
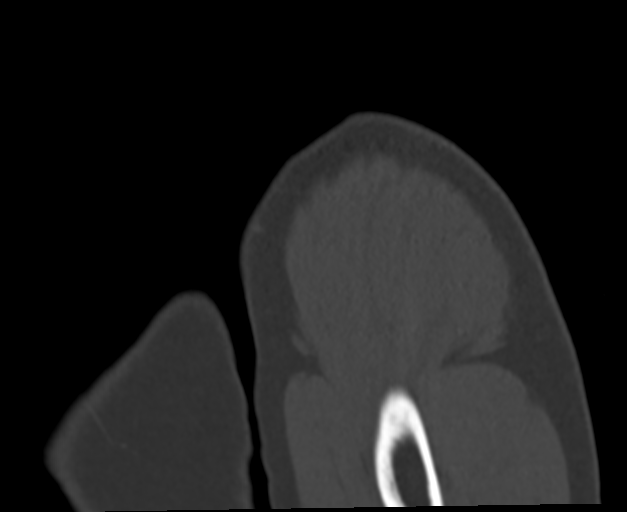
[im 39/116  bone]
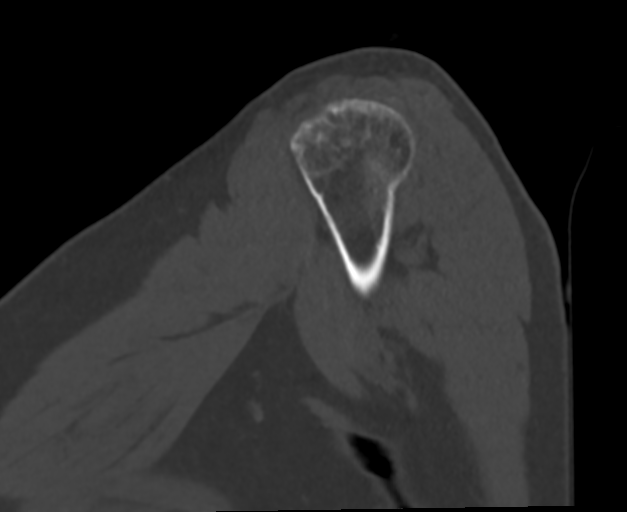
[im 58/116  bone]
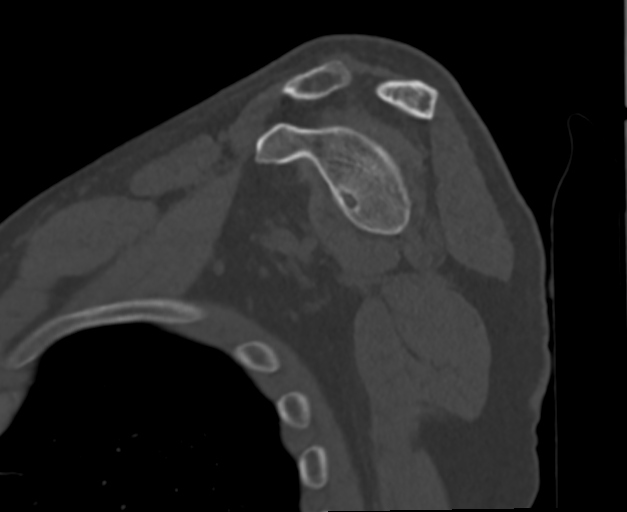
[im 77/116  bone]
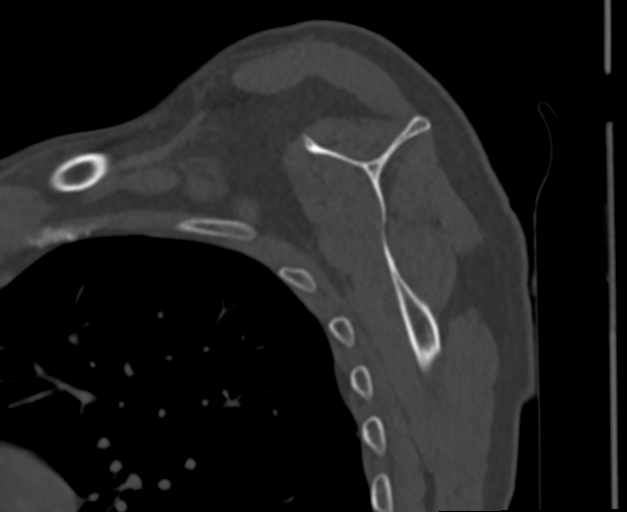
[im 96/116  bone]
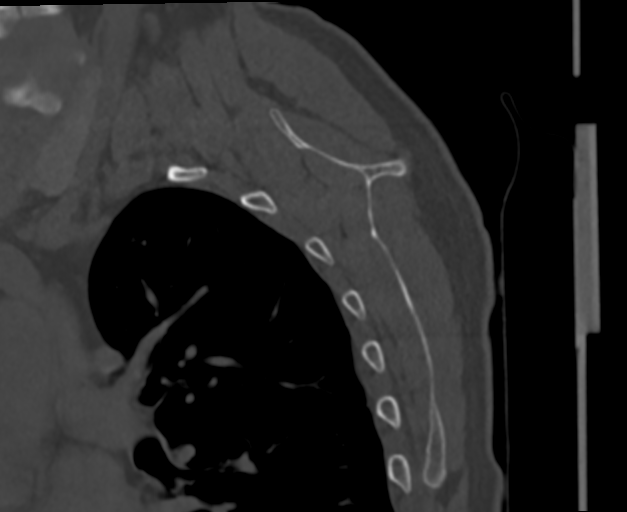

[14 of 33 positions shown; findings below may reference images not displayed]

FINDINGS: Bones/Joint/Cartilage

Hill-Sachs deformity of the humeral head. No definite bony Bankart
lesion is identified although there is degenerative subchondral
cystic change along the anterior inferior aspect of the glenoid. No
joint effusion. Chondral thinning of the glenoid and humeral head
cartilage with joint space narrowing is demonstrated. Stigmata of
prior rotator cuff repair with tunneling defects noted along the
superolateral aspect of the humeral head, series [DATE].

Type 1 acromial shape. Intact acromioclavicular joint without mild
arthropathy characterized by joint space narrowing and minimal
undersurface spurring.

Ligaments

Suboptimally assessed by CT.

Muscles and Tendons

Calcific tendinopathy of the infraspinatus, series [DATE] and series
[DATE]. Mild atrophy of the supraspinatus.

Soft tissues

No soft tissue mass or fluid collection. No subacromial nor
subdeltoid bursal fluid.
IMPRESSION: 1. Hill-Sachs deformity of the humeral head. No definite bony
Bankart lesion is identified. There is degenerative subchondral
cystic change along the anterior inferior aspect of the glenoid.
2. Stigmata of prior rotator cuff repair with tunneling defects
noted along the superolateral aspect of the humeral head. No
apparent recurrence of rotator cuff tear given limitations of a
study performed without intra-articular joint/contrast distention.
3. Calcific tendinopathy of the infraspinatus.

## 2022-02-24 DIAGNOSIS — E669 Obesity, unspecified: Secondary | ICD-10-CM | POA: Diagnosis not present

## 2022-02-24 DIAGNOSIS — E7800 Pure hypercholesterolemia, unspecified: Secondary | ICD-10-CM | POA: Diagnosis not present

## 2022-02-24 DIAGNOSIS — Z0000 Encounter for general adult medical examination without abnormal findings: Secondary | ICD-10-CM | POA: Diagnosis not present

## 2022-02-28 DIAGNOSIS — L609 Nail disorder, unspecified: Secondary | ICD-10-CM | POA: Diagnosis not present

## 2022-02-28 DIAGNOSIS — L600 Ingrowing nail: Secondary | ICD-10-CM | POA: Diagnosis not present

## 2022-04-16 DIAGNOSIS — U071 COVID-19: Secondary | ICD-10-CM | POA: Diagnosis not present

## 2022-06-17 DIAGNOSIS — L600 Ingrowing nail: Secondary | ICD-10-CM | POA: Diagnosis not present

## 2022-06-17 DIAGNOSIS — E785 Hyperlipidemia, unspecified: Secondary | ICD-10-CM | POA: Diagnosis not present

## 2022-06-17 DIAGNOSIS — J329 Chronic sinusitis, unspecified: Secondary | ICD-10-CM | POA: Diagnosis not present

## 2022-12-19 DIAGNOSIS — R001 Bradycardia, unspecified: Secondary | ICD-10-CM | POA: Diagnosis not present

## 2023-02-24 DIAGNOSIS — F411 Generalized anxiety disorder: Secondary | ICD-10-CM | POA: Diagnosis not present

## 2023-02-24 DIAGNOSIS — E7800 Pure hypercholesterolemia, unspecified: Secondary | ICD-10-CM | POA: Diagnosis not present

## 2023-02-24 DIAGNOSIS — E669 Obesity, unspecified: Secondary | ICD-10-CM | POA: Diagnosis not present

## 2023-05-21 DIAGNOSIS — R2241 Localized swelling, mass and lump, right lower limb: Secondary | ICD-10-CM | POA: Diagnosis not present

## 2023-05-21 DIAGNOSIS — M25571 Pain in right ankle and joints of right foot: Secondary | ICD-10-CM | POA: Diagnosis not present

## 2023-05-21 DIAGNOSIS — M79673 Pain in unspecified foot: Secondary | ICD-10-CM | POA: Diagnosis not present

## 2023-06-22 ENCOUNTER — Other Ambulatory Visit: Payer: Self-pay | Admitting: Family Medicine

## 2023-06-22 DIAGNOSIS — G4733 Obstructive sleep apnea (adult) (pediatric): Secondary | ICD-10-CM | POA: Diagnosis not present

## 2023-06-22 DIAGNOSIS — F411 Generalized anxiety disorder: Secondary | ICD-10-CM | POA: Diagnosis not present

## 2023-06-22 DIAGNOSIS — Z0000 Encounter for general adult medical examination without abnormal findings: Secondary | ICD-10-CM | POA: Diagnosis not present

## 2023-06-22 DIAGNOSIS — E7800 Pure hypercholesterolemia, unspecified: Secondary | ICD-10-CM | POA: Diagnosis not present

## 2023-06-22 DIAGNOSIS — R1310 Dysphagia, unspecified: Secondary | ICD-10-CM

## 2023-06-22 DIAGNOSIS — R945 Abnormal results of liver function studies: Secondary | ICD-10-CM | POA: Diagnosis not present

## 2023-10-08 DIAGNOSIS — F419 Anxiety disorder, unspecified: Secondary | ICD-10-CM | POA: Diagnosis not present

## 2023-10-08 DIAGNOSIS — F322 Major depressive disorder, single episode, severe without psychotic features: Secondary | ICD-10-CM | POA: Diagnosis not present
